# Patient Record
Sex: Male | Born: 1985 | Race: Black or African American | Hispanic: No | Marital: Single | Smoking: Never smoker
Health system: Southern US, Community
[De-identification: ages and names within clinical notes are randomized; demographics above are authoritative.]

## PROBLEM LIST (undated history)

## (undated) DIAGNOSIS — D229 Melanocytic nevi, unspecified: Secondary | ICD-10-CM

## (undated) DIAGNOSIS — I10 Essential (primary) hypertension: Secondary | ICD-10-CM

## (undated) HISTORY — PX: FRACTURE SURGERY: SHX138

## (undated) HISTORY — DX: Essential (primary) hypertension: I10

---
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
## 2007-07-02 ENCOUNTER — Emergency Department: Payer: Self-pay | Admitting: Emergency Medicine

## 2007-07-10 ENCOUNTER — Emergency Department: Payer: Self-pay | Admitting: Emergency Medicine
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
## 2009-11-11 ENCOUNTER — Emergency Department: Payer: Self-pay | Admitting: Emergency Medicine

## 2010-06-25 ENCOUNTER — Emergency Department: Payer: Self-pay | Admitting: Emergency Medicine

## 2010-12-20 ENCOUNTER — Emergency Department: Payer: Self-pay | Admitting: Emergency Medicine

## 2013-08-17 ENCOUNTER — Emergency Department: Payer: Self-pay | Admitting: Internal Medicine

## 2013-08-17 LAB — CBC
HCT: 43.2 % (ref 40.0–52.0)
HGB: 14.9 g/dL (ref 13.0–18.0)
MCH: 33.1 pg (ref 26.0–34.0)
MCV: 96 fL (ref 80–100)
Platelet: 209 10*3/uL (ref 150–440)
RBC: 4.5 10*6/uL (ref 4.40–5.90)
WBC: 11.6 10*3/uL — ABNORMAL HIGH (ref 3.8–10.6)

## 2013-08-17 LAB — COMPREHENSIVE METABOLIC PANEL
Alkaline Phosphatase: 64 U/L (ref 50–136)
Anion Gap: 6 — ABNORMAL LOW (ref 7–16)
EGFR (African American): >60
EGFR (Non-African Amer.): >60
Glucose: 102 mg/dL — ABNORMAL HIGH (ref 65–99)
Osmolality: 276 (ref 275–301)
Potassium: 3.7 mmol/L (ref 3.5–5.1)
SGOT(AST): 19 U/L (ref 15–37)
SGPT (ALT): 30 U/L (ref 12–78)
Total Protein: 7.3 g/dL (ref 6.4–8.2)

## 2013-08-17 LAB — URINALYSIS, COMPLETE
Bilirubin,UR: NEGATIVE
Blood: NEGATIVE
Ketone: NEGATIVE
Nitrite: NEGATIVE
Ph: 6 (ref 4.5–8.0)
Protein: NEGATIVE
RBC,UR: 3 /HPF (ref 0–5)
Specific Gravity: 1.024 (ref 1.003–1.030)
Squamous Epithelial: 3

## 2016-07-24 ENCOUNTER — Emergency Department: Payer: Self-pay

## 2016-07-24 ENCOUNTER — Emergency Department: Payer: Self-pay | Admitting: Anesthesiology

## 2016-07-24 ENCOUNTER — Encounter
Admission: EM | Disposition: A | Discharge status: Home / Self Care | Payer: Self-pay | Source: Home / Self Care | Attending: Student

## 2016-07-24 ENCOUNTER — Emergency Department
Admission: EM | Admit: 2016-07-24 | Discharge: 2016-07-24 | Disposition: A | Discharge status: Home / Self Care | Payer: Self-pay | Attending: Student | Admitting: Student

## 2016-07-24 ENCOUNTER — Encounter: Payer: Self-pay | Admitting: Emergency Medicine

## 2016-07-24 DIAGNOSIS — N4400 Torsion of testis, unspecified: Secondary | ICD-10-CM

## 2016-07-24 DIAGNOSIS — E669 Obesity, unspecified: Secondary | ICD-10-CM | POA: Insufficient documentation

## 2016-07-24 DIAGNOSIS — N50811 Right testicular pain: Secondary | ICD-10-CM

## 2016-07-24 DIAGNOSIS — R1031 Right lower quadrant pain: Secondary | ICD-10-CM

## 2016-07-24 DIAGNOSIS — Z6841 Body Mass Index (BMI) 40.0 and over, adult: Secondary | ICD-10-CM | POA: Insufficient documentation

## 2016-07-24 DIAGNOSIS — N433 Hydrocele, unspecified: Secondary | ICD-10-CM | POA: Insufficient documentation

## 2016-07-24 HISTORY — PX: TESTICLE TORSION REDUCTION: SHX795

## 2016-07-24 LAB — LIPASE, BLOOD: LIPASE: 21 U/L (ref 11–51)

## 2016-07-24 LAB — CHLAMYDIA/NGC RT PCR (ARMC ONLY)
Chlamydia Tr: NOT DETECTED
N gonorrhoeae: NOT DETECTED

## 2016-07-24 LAB — CBC WITH DIFFERENTIAL/PLATELET
BASOS PCT: 0 %
Basophils Absolute: 0 10*3/uL (ref 0–0.1)
EOS PCT: 0 %
Eosinophils Absolute: 0 10*3/uL (ref 0–0.7)
HEMATOCRIT: 48.4 % (ref 40.0–52.0)
Hemoglobin: 16.6 g/dL (ref 13.0–18.0)
Lymphocytes Relative: 6 %
Lymphs Abs: 0.8 10*3/uL — ABNORMAL LOW (ref 1.0–3.6)
MCH: 32.5 pg (ref 26.0–34.0)
MCHC: 34.4 g/dL (ref 32.0–36.0)
MCV: 94.5 fL (ref 80.0–100.0)
MONO ABS: 0.3 10*3/uL (ref 0.2–1.0)
MONOS PCT: 3 %
NEUTROS ABS: 12.6 10*3/uL — AB (ref 1.4–6.5)
Neutrophils Relative %: 91 %
PLATELETS: 230 10*3/uL (ref 150–440)
RBC: 5.12 MIL/uL (ref 4.40–5.90)
RDW: 12.9 % (ref 11.5–14.5)
WBC: 13.8 10*3/uL — ABNORMAL HIGH (ref 3.8–10.6)

## 2016-07-24 LAB — URINALYSIS COMPLETE WITH MICROSCOPIC (ARMC ONLY)
BILIRUBIN URINE: NEGATIVE
GLUCOSE, UA: NEGATIVE mg/dL
HGB URINE DIPSTICK: NEGATIVE
KETONES UR: NEGATIVE mg/dL
LEUKOCYTES UA: NEGATIVE
NITRITE: NEGATIVE
Protein, ur: 100 mg/dL — AB
SPECIFIC GRAVITY, URINE: 1.02 (ref 1.005–1.030)
pH: 9 — ABNORMAL HIGH (ref 5.0–8.0)

## 2016-07-24 LAB — COMPREHENSIVE METABOLIC PANEL
ALBUMIN: 4.9 g/dL (ref 3.5–5.0)
ALT: 28 U/L (ref 17–63)
ANION GAP: 7 (ref 5–15)
AST: 28 U/L (ref 15–41)
Alkaline Phosphatase: 55 U/L (ref 38–126)
BILIRUBIN TOTAL: 2.1 mg/dL — AB (ref 0.3–1.2)
BUN: 10 mg/dL (ref 6–20)
CHLORIDE: 106 mmol/L (ref 101–111)
CO2: 26 mmol/L (ref 22–32)
Calcium: 9.4 mg/dL (ref 8.9–10.3)
Creatinine, Ser: 0.9 mg/dL (ref 0.61–1.24)
GFR calc Af Amer: >60 mL/min (ref >60)
GLUCOSE: 127 mg/dL — AB (ref 65–99)
POTASSIUM: 3.9 mmol/L (ref 3.5–5.1)
Sodium: 139 mmol/L (ref 135–145)
TOTAL PROTEIN: 8.6 g/dL — AB (ref 6.5–8.1)

## 2016-07-24 SURGERY — TESTICULAR TORSION REPAIR
Anesthesia: General | Wound class: Clean Contaminated

## 2016-07-24 MED ORDER — KETOROLAC TROMETHAMINE 30 MG/ML IJ SOLN
15.0000 mg | Freq: Once | INTRAMUSCULAR | Status: AC
  Administered 2016-07-24: 15 mg via INTRAVENOUS
  Filled 2016-07-24: qty 1

## 2016-07-24 MED ORDER — PROPOFOL 10 MG/ML IV BOLUS
INTRAVENOUS | Status: DC | PRN
  Administered 2016-07-24: 250 mg via INTRAVENOUS

## 2016-07-24 MED ORDER — OXYCODONE-ACETAMINOPHEN 5-325 MG PO TABS
1.0000 | ORAL_TABLET | ORAL | Status: DC | PRN
  Administered 2016-07-24: 1 via ORAL

## 2016-07-24 MED ORDER — CEFAZOLIN SODIUM-DEXTROSE 2-4 GM/100ML-% IV SOLN: 2.0000 g | Freq: Three times a day (TID) | INTRAVENOUS | Status: DC

## 2016-07-24 MED ORDER — FENTANYL CITRATE (PF) 100 MCG/2ML IJ SOLN
INTRAMUSCULAR | Status: DC | PRN
  Administered 2016-07-24: 50 ug via INTRAVENOUS
  Administered 2016-07-24: 100 ug via INTRAVENOUS
  Administered 2016-07-24: 50 ug via INTRAVENOUS

## 2016-07-24 MED ORDER — BUPIVACAINE HCL 0.25 % IJ SOLN
INTRAMUSCULAR | Status: DC | PRN
  Administered 2016-07-24: 10 mL

## 2016-07-24 MED ORDER — OXYCODONE-ACETAMINOPHEN 5-325 MG PO TABS
ORAL_TABLET | ORAL | Status: AC
  Administered 2016-07-24: 1 via ORAL
  Filled 2016-07-24: qty 1

## 2016-07-24 MED ORDER — OXYCODONE-ACETAMINOPHEN 5-325 MG PO TABS: 1.0000 | ORAL_TABLET | ORAL | 0 refills | Status: DC | PRN

## 2016-07-24 MED ORDER — SULFAMETHOXAZOLE-TRIMETHOPRIM 800-160 MG PO TABS: 1.0000 | ORAL_TABLET | Freq: Two times a day (BID) | ORAL | 0 refills | Status: DC

## 2016-07-24 MED ORDER — SUCCINYLCHOLINE CHLORIDE 20 MG/ML IJ SOLN
INTRAMUSCULAR | Status: DC | PRN
Start: 2016-07-24 — End: 2016-07-24
  Administered 2016-07-24: 180 mg via INTRAVENOUS

## 2016-07-24 MED ORDER — SODIUM CHLORIDE 0.9 % IV BOLUS (SEPSIS)
1000.0000 mL | Freq: Once | INTRAVENOUS | Status: AC
  Administered 2016-07-24: 1000 mL via INTRAVENOUS

## 2016-07-24 MED ORDER — CEFAZOLIN SODIUM 1 G IJ SOLR
INTRAMUSCULAR | Status: DC | PRN
  Administered 2016-07-24: 3 g via INTRAMUSCULAR

## 2016-07-24 MED ORDER — ONDANSETRON HCL 4 MG/2ML IJ SOLN
INTRAMUSCULAR | Status: DC | PRN
  Administered 2016-07-24: 4 mg via INTRAVENOUS

## 2016-07-24 MED ORDER — PROMETHAZINE HCL 25 MG/ML IJ SOLN: 6.2500 mg | INTRAMUSCULAR | Status: DC | PRN

## 2016-07-24 MED ORDER — LACTATED RINGERS IV SOLN
INTRAVENOUS | Status: DC | PRN
  Administered 2016-07-24: 12:00:00 via INTRAVENOUS

## 2016-07-24 MED ORDER — FENTANYL CITRATE (PF) 100 MCG/2ML IJ SOLN: 25.0000 ug | INTRAMUSCULAR | Status: DC | PRN

## 2016-07-24 MED ORDER — SODIUM CHLORIDE 0.9 % IV BOLUS (SEPSIS): 500.0000 mL | Freq: Once | INTRAVENOUS | Status: DC

## 2016-07-24 MED ORDER — LIDOCAINE HCL (CARDIAC) 20 MG/ML IV SOLN
INTRAVENOUS | Status: DC | PRN
  Administered 2016-07-24: 100 mg via INTRAVENOUS

## 2016-07-24 MED ORDER — ONDANSETRON HCL 4 MG/2ML IJ SOLN
4.0000 mg | Freq: Once | INTRAMUSCULAR | Status: AC
  Administered 2016-07-24: 4 mg via INTRAVENOUS
  Filled 2016-07-24: qty 2

## 2016-07-24 SURGICAL SUPPLY — 29 items
BLADE SURG 15 STRL LF DISP TIS (BLADE) ×1 IMPLANT
BLADE SURG 15 STRL SS (BLADE) ×2
CANISTER SUCT 1200ML W/VALVE (MISCELLANEOUS) ×3 IMPLANT
CHLORAPREP W/TINT 26ML (MISCELLANEOUS) ×3 IMPLANT
DRAPE LAPAROTOMY 77X122 PED (DRAPES) ×3 IMPLANT
ELECT REM PT RETURN 9FT ADLT (ELECTROSURGICAL) ×3
ELECTRODE REM PT RTRN 9FT ADLT (ELECTROSURGICAL) ×1 IMPLANT
GAUZE FLUFF 18X24 1PLY STRL (GAUZE/BANDAGES/DRESSINGS) ×3 IMPLANT
GLOVE BIO SURGEON STRL SZ 6.5 (GLOVE) ×6 IMPLANT
GLOVE BIO SURGEON STRL SZ7 (GLOVE) ×3 IMPLANT
GLOVE BIO SURGEONS STRL SZ 6.5 (GLOVE) ×3
GOWN STRL REUS W/ TWL LRG LVL3 (GOWN DISPOSABLE) ×2 IMPLANT
GOWN STRL REUS W/TWL LRG LVL3 (GOWN DISPOSABLE) ×4
KIT RM TURNOVER STRD PROC AR (KITS) ×3 IMPLANT
LIQUID BAND (GAUZE/BANDAGES/DRESSINGS) ×3 IMPLANT
NEEDLE HYPO 25X1 1.5 SAFETY (NEEDLE) ×3 IMPLANT
NS IRRIG 500ML POUR BTL (IV SOLUTION) ×3 IMPLANT
PACK BASIN MINOR ARMC (MISCELLANEOUS) ×3 IMPLANT
PREP PVP WINGED SPONGE (MISCELLANEOUS) IMPLANT
SPONGE KITTNER 5P (MISCELLANEOUS) IMPLANT
SUPPORETR ATHLETIC LG (MISCELLANEOUS) ×1 IMPLANT
SUPPORTER ATHLETIC LG (MISCELLANEOUS) ×3
SUT CHROMIC 3 0 PS 2 (SUTURE) ×3 IMPLANT
SUT CHROMIC 4 0 RB 1X27 (SUTURE) ×3 IMPLANT
SUT MNCRL 4-0 (SUTURE) ×2
SUT MNCRL 4-0 27XMFL (SUTURE) ×1
SUT VICRYL 3-0 27IN (SUTURE) ×3 IMPLANT
SUTURE MNCRL 4-0 27XMF (SUTURE) ×1 IMPLANT
SYRINGE 10CC LL (SYRINGE) ×3 IMPLANT

## 2016-07-24 NOTE — ED Triage Notes (Signed)
Pt with right lower abd pain and right testicle pain. Also with back pain.

## 2016-07-24 NOTE — Anesthesia Preprocedure Evaluation (Signed)
Anesthesia Evaluation  Patient identified by MRN, date of birth, ID band Patient awake    Reviewed: Allergy & Precautions, H&P , NPO status , Patient's Chart, lab work & pertinent test results, reviewed documented beta blocker date and time   History of Anesthesia Complications Negative for: history of anesthetic complications  Airway Mallampati: III  TM Distance: >3 FB Neck ROM: full    Dental no notable dental hx. (+) Teeth Intact   Pulmonary neg pulmonary ROS,    Pulmonary exam normal breath sounds clear to auscultation       Cardiovascular Exercise Tolerance: Good negative cardio ROS Normal cardiovascular exam Rhythm:regular Rate:Normal     Neuro/Psych negative neurological ROS  negative psych ROS   GI/Hepatic Neg liver ROS, GERD  ,  Endo/Other  neg diabetesMorbid obesity  Renal/GU negative Renal ROS  negative genitourinary   Musculoskeletal   Abdominal   Peds  Hematology negative hematology ROS (+)   Anesthesia Other Findings History reviewed. No pertinent past medical history.   Reproductive/Obstetrics negative OB ROS                             Anesthesia Physical Anesthesia Plan  ASA: III  Anesthesia Plan: General ETT   Post-op Pain Management:    Induction:   Airway Management Planned:   Additional Equipment:   Intra-op Plan:   Post-operative Plan:   Informed Consent: I have reviewed the patients History and Physical, chart, labs and discussed the procedure including the risks, benefits and alternatives for the proposed anesthesia with the patient or authorized representative who has indicated his/her understanding and acceptance.   Dental Advisory Given  Plan Discussed with: Anesthesiologist, CRNA and Surgeon  Anesthesia Plan Comments:         Anesthesia Quick Evaluation

## 2016-07-24 NOTE — Anesthesia Procedure Notes (Signed)
Procedure Name: Intubation Performed by: Ashyla Luth Pre-anesthesia Checklist: Patient identified, Patient being monitored, Timeout performed, Emergency Drugs available and Suction available Patient Re-evaluated:Patient Re-evaluated prior to inductionOxygen Delivery Method: Circle system utilized Preoxygenation: Pre-oxygenation with 100% oxygen Intubation Type: IV induction Ventilation: Mask ventilation without difficulty Laryngoscope Size: Mac and 4 Grade View: Grade I Tube type: Oral Tube size: 7.5 mm Number of attempts: 1 Airway Equipment and Method: Stylet Placement Confirmation: ETT inserted through vocal cords under direct vision,  positive ETCO2 and breath sounds checked- equal and bilateral Secured at: 23 cm Tube secured with: Tape Dental Injury: Teeth and Oropharynx as per pre-operative assessment        

## 2016-07-24 NOTE — Discharge Instructions (Signed)
Testicular Torsion Testicular torsion is a twisting of the spermatic cord, artery, and vein that go to the testicle. This twisting cuts off the blood supply to everything in the sac that contains the testes, blood vessels, and part of the spermatic cord (scrotum). Testicular torsion is most commonly seen in newborn and adolescent males. It can also occur before birth. Testicular torsion requires emergency treatment. The testicle usually can be saved if the torsion is treated within 6 hours of onset. If the torsion is left untreated for too long, the testicle will die and have to be removed.  CAUSES  Torsion can be caused by a hit on the scrotum or by certain movements during exercise. In some males, testicular torsion is more common because the connection of their testicle to a specific tissue in their scrotum developed in the wrong place, allowing the testicle to rotate and the cord to get twisted.  SIGNS AND SYMPTOMS  The main symptom of testicular torsion is pain in your testicle. The scrotum may be swollen, red, hard, and very tender. There will be excess fluid in the tissue (edema).The testicle may be higher than normal in the scrotum. The skin of the scrotum may be stuck to the testicle. You may have nausea, vomiting, and a fever. DIAGNOSIS  Often testicular torsion is diagnosed through a physical exam. Sometimes imaging exams and tests to measure blood flow may be done. TREATMENT  A manual untwisting of the testicle may be done when the testicle is still mobile and the maneuver is not too painful. However, surgery usually is necessary and should be done as soon as possible after torsion occurs. During surgery, the testicle is untwisted and evaluated and possibly removed.    This information is not intended to replace advice given to you by your health care provider. Make sure you discuss any questions you have with your health care provider.   Document Released: 10/10/2005 Document Revised:  10/15/2013 Document Reviewed: 04/01/2013 Elsevier Interactive Patient Education 2016 [employer]   AMBULATORY SURGERY  DISCHARGE INSTRUCTIONS   1) The drugs that you were given will stay in your system until tomorrow so for the next 24 hours you should not:  A) Drive an automobile B) Make any legal decisions C) Drink any alcoholic beverage   2) You may resume regular meals tomorrow.  Today it is better to start with liquids and gradually work up to solid foods.  You may eat anything you prefer, but it is better to start with liquids, then soup and crackers, and gradually work up to solid foods.   3) Please notify your doctor immediately if you have any unusual bleeding, trouble breathing, redness and pain at the surgery site, drainage, fever, or pain not relieved by medication.    4) Additional Instructions:Pt may return to work on Monday October 9.        Please contact your physician with any problems or Same Day Surgery at [phone], Monday through Friday 6 am to 4 pm, or [place] [facility] Main number at [phone].

## 2016-07-24 NOTE — ED Provider Notes (Signed)
[facility] Emergency Department Provider Note   ____________________________________________   First MD Initiated Contact with Patient 07/24/16 [phone]     (approximate)  I have reviewed the triage vital signs and the nursing notes.   HISTORY  Chief Complaint Abdominal Pain and Testicle Pain    HPI Phillip Guerrero is a 30 y.o. male with no chronic medical problems presents for evaluation of right lower quadrant pain radiating into the right testicle and the right flank, sudden onset this morning, constant since onset, no modifying factors. No nausea or vomiting or diarrhea, no fevers or chills. He thought he needed to have a bowel movement, but he did have a normal bowel movement this morning and has had persistent pain. He took a laxative with no improvement. He has not had any abnormal penile discharge, no hematuria or dysuria.   No past medical history on file.  There are no active problems to display for this patient.   Past Surgical History:  Procedure Laterality Date  . FRACTURE SURGERY      Prior to Admission medications   Not on File    Allergies Review of patient's allergies indicates no known allergies.  No family history on file.  Social History Social History  Substance Use Topics  . Smoking status: Never Smoker  . Smokeless tobacco: Never Used  . Alcohol use Yes    Review of Systems Constitutional: No fever/chills Eyes: No visual changes. ENT: No sore throat. Cardiovascular: Denies chest pain. Respiratory: Denies shortness of breath. Gastrointestinal: + abdominal pain.  No nausea, no vomiting.  No diarrhea.  No constipation. Genitourinary: Negative for dysuria. Musculoskeletal: Positive for right flank pain. Skin: Negative for rash. Neurological: Negative for headaches, focal weakness or numbness.  10-point ROS otherwise negative.  ____________________________________________   PHYSICAL EXAM:  VITAL SIGNS: ED  Triage Vitals  Enc Vitals Group     BP 07/24/16 0903 (!) 155/88     Pulse Rate 07/24/16 0903 63     Resp --      Temp 07/24/16 0903 98.1 F (36.7 C)     Temp Source 07/24/16 0903 Oral     SpO2 07/24/16 0903 98 %     Weight 07/24/16 0903 (!) 373 lb (169.2 kg)     Height 07/24/16 0903 6\' 4"  (1.93 m)     Head Circumference --      Peak Flow --      Pain Score 07/24/16 0904 7     Pain Loc --      Pain Edu? --      Excl. in GC? --     Constitutional: Alert and oriented. Well appearing and in no acute distress. Eyes: Conjunctivae are normal. PERRL. EOMI. Head: Atraumatic. Nose: No congestion/rhinnorhea. Mouth/Throat: Mucous membranes are moist.  Oropharynx non-erythematous. Neck: No stridor.  Supple without meningismus. Cardiovascular: Normal rate, regular rhythm. Grossly normal heart sounds.  Good peripheral circulation. Respiratory: Normal respiratory effort.  No retractions. Lungs CTAB. Gastrointestinal: Soft With tenderness to palpation in the right mid abdomen in the right lower quadrant. No CVA tenderness. Genitourinary: exam preformed with nurse Kim at bedside, testicles are descended bilaterally, they are not edematous, there is no discoloration but there is some mild tenderness in the right testicle. Musculoskeletal: No lower extremity tenderness nor edema.  No joint effusions. Neurologic:  Normal speech and language. No gross focal neurologic deficits are appreciated. No gait instability. Skin:  Skin is warm, dry and intact. No rash noted. Psychiatric: Mood  and affect are normal. Speech and behavior are normal.  ____________________________________________   LABS (all labs ordered are listed, but only abnormal results are displayed)  Labs Reviewed  CBC WITH DIFFERENTIAL/PLATELET - Abnormal; Notable for the following:       Result Value   WBC 13.8 (*)    Neutro Abs 12.6 (*)    Lymphs Abs 0.8 (*)    All other components within normal limits  COMPREHENSIVE METABOLIC  PANEL - Abnormal; Notable for the following:    Glucose, Bld 127 (*)    Total Protein 8.6 (*)    Total Bilirubin 2.1 (*)    All other components within normal limits  URINALYSIS COMPLETEWITH MICROSCOPIC ([facility] ONLY) - Abnormal; Notable for the following:    Color, Urine YELLOW (*)    APPearance CLOUDY (*)    pH 9.0 (*)    Protein, ur 100 (*)    Bacteria, UA MANY (*)    Squamous Epithelial / LPF 0-5 (*)    All other components within normal limits  CHLAMYDIA/NGC RT PCR ([facility] ONLY)  LIPASE, BLOOD   ____________________________________________  EKG  none ____________________________________________  RADIOLOGY  US scrotum IMPRESSION:  1. Positive for right testicular torsion. Absent blood flow to the  right testicle and epididymis. Echogenicity of the right testicle  remains normal. Small simple appearing right hydrocele.  2. Normal left testicle.       ____________________________________________   PROCEDURES  Procedure(s) performed: None  Procedures  Critical Care performed:   CRITICAL CARE Performed by: Toney Rakes A   Total critical care time: 30 minutes  Critical care time was exclusive of separately billable procedures and treating other patients.  Critical care was necessary to treat or prevent imminent or life-threatening deterioration.  Critical care was time spent personally by me on the following activities: development of treatment plan with patient and/or surrogate as well as nursing, discussions with consultants, evaluation of patient's response to treatment, examination of patient, obtaining history from patient or surrogate, ordering and performing treatments and interventions, ordering and review of laboratory studies, ordering and review of radiographic studies, pulse oximetry and re-evaluation of patient's condition.  ____________________________________________   INITIAL IMPRESSION / ASSESSMENT AND PLAN / ED COURSE  Pertinent  labs & imaging results that were available during my care of the patient were reviewed by me and considered in my medical decision making (see chart for details).  Phillip Guerrero is a 30 y.o. male with no chronic medical problems presents for evaluation of right lower quadrant pain radiating into the right testicle and the right back, sudden onset this morning, constant since onset, no modifying factors. On exam, he is nontoxic appearing and in no acute distress. Vital signs stable, he is afebrile. No tenderness to palpation in the right mid abdomen in the right lower quadrant as well as the right testicle. We'll obtain screening labs, urinalysis, urine GC chlamydia, treat him symptomatically. Will begin with an ultrasound of the scrotum to rule out torsion, if unremarkable, we'll likely pursue CT of the abdomen and pelvis to evaluate for obstructing kidney stone or appendicitis.  ----------------------------------------- 11:02 AM on 07/24/2016 ----------------------------------------- CBC with leukocytosis, unremarkable CMP, urinalysis with many bacteria, no leukocytes or nitrites and minimal WBCs. Ultrasound shows testicular torsion. I discussed this with Dr. Thea Silversmith of urology who will evaluate the patient and taken to the operating room. Patient reports his pain is improved at this time.   Clinical Course     ____________________________________________   FINAL CLINICAL  IMPRESSION(S) / ED DIAGNOSES  Final diagnoses:  Pain in right testicle  Right lower quadrant abdominal pain  Testicular torsion      NEW MEDICATIONS STARTED DURING THIS VISIT:  New Prescriptions   No medications on file     Note:  This document was prepared using Dragon voice recognition software and may include unintentional dictation errors.    Gayla Doss, MD 07/24/16 [phone]

## 2016-07-24 NOTE — ED Notes (Signed)
Patient transported to Ultrasound 

## 2016-07-24 NOTE — Brief Op Note (Signed)
07/24/2016  12:57 PM  PATIENT:  Phillip Guerrero  30 y.o. male  PRE-OPERATIVE DIAGNOSIS:  testicular torsion  POST-OPERATIVE DIAGNOSIS:  testicular torsion  PROCEDURE:  Procedure(s): TESTICULAR TORSION REPAIR (N/A)  SURGEON:  Surgeon(s) and Role:    * Malen GauzePatrick L Renu Asby, MD - Primary  PHYSICIAN ASSISTANT:   ASSISTANTS: none   ANESTHESIA:   general  EBL:  Total I/O In: 1450 [I.V.:450; IV Piggyback:1000] Out: -   BLOOD ADMINISTERED:none  DRAINS: none   LOCAL MEDICATIONS USED:  MARCAINE     SPECIMEN:  No Specimen  DISPOSITION OF SPECIMEN:  N/A  COUNTS:  YES  TOURNIQUET:  * No tourniquets in log *  DICTATION: .Note written in EPIC  PLAN OF CARE: Discharge to home after PACU  PATIENT DISPOSITION:  PACU - hemodynamically stable.   Delay start of Pharmacological VTE agent (>24hrs) due to surgical blood loss or risk of bleeding: not applicable

## 2016-07-24 NOTE — Transfer of Care (Signed)
Immediate Anesthesia Transfer of Care Note  Patient: Phillip Guerrero  Procedure(s) Performed: Procedure(s): TESTICULAR TORSION REPAIR (N/A)  Patient Location: PACU  Anesthesia Type:General  Level of Consciousness: sedated and responds to stimulation  Airway & Oxygen Therapy: Patient Spontanous Breathing and Patient connected to face mask oxygen  Post-op Assessment: Report given to RN and Post -op Vital signs reviewed and stable  Post vital signs: Reviewed and stable  Last Vitals:  Vitals:   07/24/16 1140 07/24/16 1309  BP: (!) 157/76 137/83  Pulse: 64 92  Resp:  14  Temp: 36.8 C     Last Pain:  Vitals:   07/24/16 1140  TempSrc: Oral  PainSc:          Complications: No apparent anesthesia complications

## 2016-07-24 NOTE — ED Notes (Signed)
Consent signed by patient. Per patient's request, patient was unhooked from fluids to use the restroom after taking a laxative at home this morning.

## 2016-07-24 NOTE — Consult Note (Signed)
Urology Consult  Referring physician: Dr. Edd Fabian Reason for referral: Testicular torsion  Chief Complaint: Right testicular pain  History of Present Illness: Mr Littles is a 30yo with no significant PMH that was awoken by severe right testicular pain. He has never had this pain before. The pain is sharp, constant, severe, nonradiating right testicular pain. He has associated nausea and vomiting. No exacerbating/alleviating events. No LUTS. Scrotal US shows absent right testicular and epididymal blood flow  No past medical history on file. Past Surgical History:  Procedure Laterality Date  . FRACTURE SURGERY      Medications: I have reviewed the patient's current medications. Allergies: No Known Allergies  No family history on file. Social History:  reports that he has never smoked. He has never used smokeless tobacco. He reports that he drinks alcohol. He reports that he does not use drugs.  Review of Systems  Constitutional: Positive for chills.  Gastrointestinal: Positive for abdominal pain, nausea and vomiting.    Physical Exam:  Vital signs in last 24 hours: Temp:  [98.1 F (36.7 C)] 98.1 F (36.7 C) (10/01 0903) Pulse Rate:  [63] 63 (10/01 0903) BP: (155)/(88) 155/88 (10/01 0903) SpO2:  [98 %] 98 % (10/01 0903) Weight:  [169.2 kg (373 lb)] 169.2 kg (373 lb) (10/01 5726) Physical Exam  Constitutional: He is oriented to person, place, and time. He appears well-developed and well-nourished.  HENT:  Head: Normocephalic and atraumatic.  Eyes: EOM are normal. Pupils are equal, round, and reactive to light.  Neck: Normal range of motion. No thyromegaly present.  Cardiovascular: Normal rate and regular rhythm.   Respiratory: Effort normal. No respiratory distress.  GI: Soft. He exhibits no distension. Hernia confirmed negative in the right inguinal area and confirmed negative in the left inguinal area.  Genitourinary: Penis normal. Right testis shows swelling and tenderness.  Right testis shows no mass. Right testis is descended. Cremasteric reflex is absent on the right side. Left testis shows no mass, no swelling and no tenderness. Left testis is descended. Cremasteric reflex is not absent on the left side. Circumcised.  Musculoskeletal: Normal range of motion. He exhibits no edema.  Lymphadenopathy:       Right: No inguinal adenopathy present.       Left: No inguinal adenopathy present.  Neurological: He is alert and oriented to person, place, and time.  Skin: Skin is warm and dry.  Psychiatric: He has a normal mood and affect. His behavior is normal. Judgment and thought content normal.    Laboratory Data:  Results for orders placed or performed during the hospital encounter of 07/24/16 (from the past 72 hour(s))  CBC with Differential     Status: Abnormal   Collection Time: 07/24/16  9:40 AM  Result Value Ref Range   WBC 13.8 (H) 3.8 - 10.6 K/uL   RBC 5.12 4.40 - 5.90 MIL/uL   Hemoglobin 16.6 13.0 - 18.0 g/dL   HCT 48.4 40.0 - 52.0 %   MCV 94.5 80.0 - 100.0 fL   MCH 32.5 26.0 - 34.0 pg   MCHC 34.4 32.0 - 36.0 g/dL   RDW 12.9 11.5 - 14.5 %   Platelets 230 150 - 440 K/uL   Neutrophils Relative % 91 %   Neutro Abs 12.6 (H) 1.4 - 6.5 K/uL   Lymphocytes Relative 6 %   Lymphs Abs 0.8 (L) 1.0 - 3.6 K/uL   Monocytes Relative 3 %   Monocytes Absolute 0.3 0.2 - 1.0 K/uL   Eosinophils Relative  0 %   Eosinophils Absolute 0.0 0 - 0.7 K/uL   Basophils Relative 0 %   Basophils Absolute 0.0 0 - 0.1 K/uL  Comprehensive metabolic panel     Status: Abnormal   Collection Time: 07/24/16  9:40 AM  Result Value Ref Range   Sodium 139 135 - 145 mmol/L   Potassium 3.9 3.5 - 5.1 mmol/L   Chloride 106 101 - 111 mmol/L   CO2 26 22 - 32 mmol/L   Glucose, Bld 127 (H) 65 - 99 mg/dL   BUN 10 6 - 20 mg/dL   Creatinine, Ser 0.90 0.61 - 1.24 mg/dL   Calcium 9.4 8.9 - 10.3 mg/dL   Total Protein 8.6 (H) 6.5 - 8.1 g/dL   Albumin 4.9 3.5 - 5.0 g/dL   AST 28 15 - 41 U/L    ALT 28 17 - 63 U/L   Alkaline Phosphatase 55 38 - 126 U/L   Total Bilirubin 2.1 (H) 0.3 - 1.2 mg/dL   GFR calc non Af Amer >60 >60 mL/min   GFR calc Af Amer >60 >60 mL/min    Comment: (NOTE) The eGFR has been calculated using the CKD EPI equation. This calculation has not been validated in all clinical situations. eGFR's persistently <60 mL/min signify possible Chronic Kidney Disease.    Anion gap 7 5 - 15  Lipase, blood     Status: None   Collection Time: 07/24/16  9:40 AM  Result Value Ref Range   Lipase 21 11 - 51 U/L   No results found for this or any previous visit (from the past 240 hour(s)). Creatinine:  Recent Labs  07/24/16 0940  CREATININE 0.90   Baseline Creatinine: 0.9  Impression/Assessment:  30yo with right testicular torsion  Plan:  The risks/benefits/alternatives to bilateral orchidopexy was explained to the patient and he understands and wishes to proceed with surgery. Patient manually detorsed at the bedside  Nicolette Bang 07/24/2016, 10:50 AM

## 2016-07-24 NOTE — OR Nursing (Signed)
Received IVF Total (OR and PACU/Postop)

## 2016-07-25 ENCOUNTER — Encounter: Payer: Self-pay | Admitting: Urology

## 2016-07-25 NOTE — Anesthesia Postprocedure Evaluation (Signed)
Anesthesia Post Note  Patient: Phillip Guerrero  Procedure(s) Performed: Procedure(s) (LRB): TESTICULAR TORSION REPAIR (N/A)  Patient location during evaluation: PACU Anesthesia Type: General Level of consciousness: awake and alert Pain management: pain level controlled Vital Signs Assessment: post-procedure vital signs reviewed and stable Respiratory status: spontaneous breathing, nonlabored ventilation, respiratory function stable and patient connected to nasal cannula oxygen Cardiovascular status: blood pressure returned to baseline and stable Postop Assessment: no signs of nausea or vomiting Anesthetic complications: no    Last Vitals:  Vitals:   07/24/16 1432 07/24/16 1450  BP:  (!) 152/83  Pulse: 80 80  Resp: 20 20  Temp:      Last Pain:  Vitals:   07/24/16 1450  TempSrc:   PainSc: 3                  Lenard SimmerAndrew Nature Vogelsang

## 2016-08-01 NOTE — Op Note (Signed)
Preoperative diagnosis: Right testicular torsion  Postoperative diagnosis: Same  Procedure: 1. Excision of bilateral appendix testis 2. Bilateral orchidopexy  Attending: Wilkie AyePatrick Mikeria Valin, MD  Anesthesia: General  History of blood loss: Minimal  Antibiotics: ancef  Drains: none  Specimens: none   Findings: 360 degree right testicular torsion  Indications: Patient is a 30 year old male with right testicular torsion. After discussing the treatment options the patient elected to proceed to bilateral scrotal exploration and bilateral orchidopexy  Procedure in detail: Prior to procedure consent was obtained.  Patient was brought to the operating room and a brief timeout was done to ensure correct patient, correct procedure, correct site.  General anesthesia was administered and patient was placed in supine position.  His genitalia was then prepped and draped in usual sterile fashion.  A 3 cm incision was made in the right hemiscrotum.  We dissected down to the tunica and then incised the tunica. A small hydrocele was encountered and was drained. We noted the testicle to be torsed 360 degrees which was then promptly corrected and bloodflow was ensured with the microdoppler. We then excised the hydrocele sac and then over sewed the edge with 2-0 Vicryl in a running fashion. We then excised the right appendix testis. Hemostasis was then obtained with electrocautery.  We then returned the testis to the right hemiscrotum and placed 3-0 proline suture on the medial and lateral aspect of the testis. We then attached this to the medial and lateral aspect of the dartos respectively. We then closed the overlying dartos with 3-0 vicryl in a running fashion. The skin was then closed with 4-0 monocryl in a running fashion.  We then turned our attention to the left side. A 3 cm incision was made in the left hemiscrotum.  We dissected down to the tunica and then incised the tunica. A small hydrocele was  encountered and was drained.  We then excised the hydrocele sac and then over sewed the edge with 2-0 Vicryl in a running fashion. We then excised the left appendix testis. Hemostasis was then obtained with electrocautery.  We then returned the testis to the left hemiscrotum and placed 3-0 proline suture on the medial and lateral aspect of the testis. We then attached this to the medial and lateral aspect of the dartos respectively. We thn closed the overlying dartos with 3-0 vicryl in a running fashion. The skin was then closed with 4-0 monocryl in a running fashion.Dermabond was placed on the incision.  A dressing was then applied to the incision.  We then placed a scrotal fluff and this then concluded the procedure which was well tolerated by the patient.  Complications: None  Condition: Stable, extubated, transferred to PACU.  Plan: Patient is to be discharged home.  He is to follow up in 2 weeks for wound check.

## 2016-08-05 ENCOUNTER — Ambulatory Visit: Payer: Self-pay

## 2016-08-11 ENCOUNTER — Ambulatory Visit (INDEPENDENT_AMBULATORY_CARE_PROVIDER_SITE_OTHER): Payer: Self-pay | Admitting: Urology

## 2016-08-11 ENCOUNTER — Encounter: Payer: Self-pay | Admitting: Urology

## 2016-08-11 VITALS — BP 150/89 | HR 73 | Ht 76.0 in | Wt 372.6 lb

## 2016-08-11 DIAGNOSIS — N4400 Torsion of testis, unspecified: Secondary | ICD-10-CM

## 2016-08-11 NOTE — Progress Notes (Signed)
08/11/2016 11:13 AM   Jaci CarrelAdrian C Viscuso 12-09-1985 [id number]  Referring provider: No referring provider defined for this encounter.  Chief Complaint  Patient presents with  . Routine Post Op    tesricular torsion     HPI: The patient is a 30 year old gentleman who recently had a  right testicular torsion was underwent bilateral orchiopexy. He presents today for wound check. He is doing well since surgery. He sells minor discomfort in his testicles after surgery but no complaints. No fevers or chills. No drainage from his incisions.   PMH: No past medical history on file.  Surgical History: Past Surgical History:  Procedure Laterality Date  . FRACTURE SURGERY    . TESTICLE TORSION REDUCTION N/A 07/24/2016   Procedure: TESTICULAR TORSION REPAIR;  Surgeon: Malen GauzePatrick L McKenzie, MD;  Location: [facility] ORS;  Service: Urology;  Laterality: N/A;    Home Medications:    Medication List    as of 08/11/2016 11:13 AM   You have not been prescribed any medications.     Allergies: No Known Allergies  Family History: No family history on file.  Social History:  reports that he has never smoked. He has never used smokeless tobacco. He reports that he drinks alcohol. He reports that he does not use drugs.  ROS: UROLOGY Frequent Urination?: No Hard to postpone urination?: No Burning/pain with urination?: No Get up at night to urinate?: No Leakage of urine?: No Urine stream starts and stops?: No Trouble starting stream?: No Do you have to strain to urinate?: No Blood in urine?: No Urinary tract infection?: No Sexually transmitted disease?: No Injury to kidneys or bladder?: No Painful intercourse?: No Weak stream?: No Erection problems?: No Penile pain?: No  Gastrointestinal Nausea?: No Vomiting?: No Indigestion/heartburn?: No Diarrhea?: No Constipation?: No  Constitutional Fever: No Night sweats?: No Weight loss?: No Fatigue?: No  Skin Skin rash/lesions?:  No Itching?: No  Eyes Blurred vision?: No Double vision?: No  Ears/Nose/Throat Sore throat?: No Sinus problems?: No  Hematologic/Lymphatic Swollen glands?: No Easy bruising?: No  Cardiovascular Leg swelling?: No Chest pain?: No  Respiratory Cough?: No Shortness of breath?: No  Endocrine Excessive thirst?: No  Musculoskeletal Back pain?: No Joint pain?: No  Neurological Headaches?: No Dizziness?: No  Psychologic Depression?: No Anxiety?: No  Physical Exam: BP (!) 150/89   Pulse 73   Ht 6\' 4"  (1.93 m)   Wt (!) 372 lb 9.6 oz (169 kg)   BMI 45.35 kg/m   Constitutional:  Alert and oriented, No acute distress. HEENT: [place] AT, moist mucus membranes.  Trachea midline, no masses. Cardiovascular: No clubbing, cyanosis, or edema. Respiratory: Normal respiratory effort, no increased work of breathing. GI: Abdomen is soft, nontender, nondistended, no abdominal masses GU: No CVA tenderness. Scrotal incisions healing well post surgery. Minimal edema. Mild tenderness to palpation that is appropriate. No sign of infection. Skin: No rashes, bruises or suspicious lesions. Lymph: No cervical or inguinal adenopathy. Neurologic: Grossly intact, no focal deficits, moving all 4 extremities. Psychiatric: Normal mood and affect.  Laboratory Data: Lab Results  Component Value Date   WBC 13.8 (H) 07/24/2016   HGB 16.6 07/24/2016   HCT 48.4 07/24/2016   MCV 94.5 07/24/2016   PLT 230 07/24/2016    Lab Results  Component Value Date   CREATININE 0.90 07/24/2016    No results found for: PSA  No results found for: TESTOSTERONE  No results found for: HGBA1C  Urinalysis    Component Value Date/Time   COLORURINE  YELLOW (A) 07/24/2016 0941   APPEARANCEUR CLOUDY (A) 07/24/2016 0941   APPEARANCEUR Clear 08/17/2013 0759   LABSPEC 1.020 07/24/2016 0941   LABSPEC 1.024 08/17/2013 0759   PHURINE 9.0 (H) 07/24/2016 0941   GLUCOSEU NEGATIVE 07/24/2016 0941   GLUCOSEU Negative  08/17/2013 0759   HGBUR NEGATIVE 07/24/2016 0941   BILIRUBINUR NEGATIVE 07/24/2016 0941   BILIRUBINUR Negative 08/17/2013 0759   KETONESUR NEGATIVE 07/24/2016 0941   PROTEINUR 100 (A) 07/24/2016 0941   NITRITE NEGATIVE 07/24/2016 0941   LEUKOCYTESUR NEGATIVE 07/24/2016 0941   LEUKOCYTESUR 1+ 08/17/2013 0759      Assessment & Plan:    1. Testicular torsion The patient is doing well status post orchiopexy bilaterally. The patient will follow-up as needed.  Return if symptoms worsen or fail to improve.  Hildred Laser, MD  [facility] [address] [place], [place] [address] [phone]

## 2016-09-16 ENCOUNTER — Ambulatory Visit (INDEPENDENT_AMBULATORY_CARE_PROVIDER_SITE_OTHER): Payer: Self-pay | Admitting: Physician Assistant

## 2016-09-16 VITALS — BP 128/84 | HR 84 | Temp 98.1°F | Resp 16 | Ht 76.0 in | Wt 375.4 lb

## 2016-09-16 DIAGNOSIS — Z0289 Encounter for other administrative examinations: Secondary | ICD-10-CM

## 2016-09-16 NOTE — Progress Notes (Signed)
Commercial Driver Medical Examination   Phillip Guerrero is a [age] y.o. male who presents today for a commercial driver fitness determination physical exam. The patient reports no problems today. In the past the patient reports receiving 2 year certificates. He denies focal neurological deficits, vision and hearing changes. He denies the habitual use of benzodiazepines and opioids.  History reviewed. No pertinent past medical history.  Current medications, family history, allergies, social history reviewed by me and exist elsewhere in the encounter.   Review of Systems  Constitutional: Negative for fever.  Respiratory: Negative for cough and shortness of breath.   Cardiovascular: Negative for chest pain and leg swelling.  Skin: Negative for rash.  Neurological: Negative for dizziness, tingling, tremors, sensory change, speech change, focal weakness, seizures, loss of consciousness and headaches.  Endo/Heme/Allergies: Negative for polydipsia.    Objective:     Vision/hearing:  Visual Acuity Screening   Right eye Left eye Both eyes  Without correction: 20/20 20/2015 20/20  With correction:     Comments: Sees at 70 degrees  Hearing Screening Comments: Hears a whisper at 10 feet  Applicant can recognize and distinguish among traffic control signals and devices showing standard red, green, and amber colors.  Corrective lenses required: No  Monocular Vision?: No  Hearing aid requirement: No  Physical Exam  Constitutional: He is oriented to person, place, and time. He appears well-developed and well-nourished.  HENT:  Head: Normocephalic and atraumatic.  Right Ear: Hearing, tympanic membrane, external ear and ear canal normal.  Left Ear: Hearing, tympanic membrane, external ear and ear canal normal.  Nose: Nose normal.  Mouth/Throat: Uvula is midline, oropharynx is clear and moist and mucous membranes are normal.  Eyes: Conjunctivae, EOM and lids are normal. Pupils are equal,  round, and reactive to light. Right eye exhibits no discharge. Left eye exhibits no discharge. No scleral icterus.  Neck: Trachea normal and normal range of motion. Neck supple. Carotid bruit is not present.  Cardiovascular: Normal rate, regular rhythm, normal heart sounds, intact distal pulses and normal pulses.   No murmur heard. Pulmonary/Chest: Effort normal and breath sounds normal. He has no rhonchi.  Abdominal: Soft. Normal appearance and bowel sounds are normal. He exhibits no abdominal bruit. There is no tenderness.  Musculoskeletal: Normal range of motion. He exhibits no edema, tenderness or deformity.  Lymphadenopathy:       Head (right side): No submental, no submandibular, no tonsillar, no preauricular, no posterior auricular and no occipital adenopathy present.       Head (left side): No submental, no submandibular, no tonsillar, no preauricular, no posterior auricular and no occipital adenopathy present.    He has no cervical adenopathy.  Neurological: He is alert and oriented to person, place, and time. He has normal strength and normal reflexes. He is not disoriented. He displays normal reflexes. No cranial nerve deficit or sensory deficit. He exhibits normal muscle tone. Coordination and gait normal. GCS eye subscore is 4. GCS verbal subscore is 5. GCS motor subscore is 6.  Skin: Skin is warm, dry and intact. No lesion and no rash noted.  Psychiatric: He has a normal mood and affect. His speech is normal and behavior is normal. Judgment and thought content normal.    BP 128/84 (BP Location: Left Arm, Patient Position: Sitting, Cuff Size: Large)   Pulse 84   Temp 98.1 F (36.7 C) (Oral)   Resp 16   Ht 6\' 4"  (1.93 m)   Wt (!) 375 lb 6.4  oz (170.3 kg)   SpO2 98%   BMI 45.70 kg/m   Labs:  Glucose: 1.005, Protein -, Blood -, Sugar -   Assessment:    Healthy male exam.  Meets standards in 4049 CFR 391.41;  qualifies for 2 year certificate.    Plan:    Medical  examiners certificate completed and printed. Return as needed.

## 2016-09-16 NOTE — Patient Instructions (Signed)
     IF you received an x-ray today, you will receive an invoice from [facility]. Please contact [facility] at [phone] with questions or concerns regarding your invoice.   IF you received labwork today, you will receive an invoice from [facility]. Please contact [facility] at [phone] with questions or concerns regarding your invoice.   Our billing staff will not be able to assist you with questions regarding bills from these companies.  You will be contacted with the lab results as soon as they are available. The fastest way to get your results is to activate your My Chart account. Instructions are located on the last page of this paperwork. If you have not heard from us regarding the results in 2 weeks, please contact this office.      

## 2016-09-18 ENCOUNTER — Emergency Department
Admission: EM | Admit: 2016-09-18 | Discharge: 2016-09-18 | Disposition: A | Discharge status: Home / Self Care | Payer: Self-pay | Attending: Emergency Medicine | Admitting: Emergency Medicine

## 2016-09-18 ENCOUNTER — Emergency Department: Payer: Self-pay

## 2016-09-18 DIAGNOSIS — S8392XA Sprain of unspecified site of left knee, initial encounter: Secondary | ICD-10-CM | POA: Insufficient documentation

## 2016-09-18 DIAGNOSIS — X501XXA Overexertion from prolonged static or awkward postures, initial encounter: Secondary | ICD-10-CM | POA: Insufficient documentation

## 2016-09-18 DIAGNOSIS — Y92009 Unspecified place in unspecified non-institutional (private) residence as the place of occurrence of the external cause: Secondary | ICD-10-CM | POA: Insufficient documentation

## 2016-09-18 DIAGNOSIS — Y999 Unspecified external cause status: Secondary | ICD-10-CM | POA: Insufficient documentation

## 2016-09-18 DIAGNOSIS — Y9389 Activity, other specified: Secondary | ICD-10-CM | POA: Insufficient documentation

## 2016-09-18 MED ORDER — HYDROCODONE-ACETAMINOPHEN 5-325 MG PO TABS
1.0000 | ORAL_TABLET | Freq: Once | ORAL | Status: AC
  Administered 2016-09-18: 1 via ORAL
  Filled 2016-09-18: qty 1

## 2016-09-18 MED ORDER — HYDROCODONE-ACETAMINOPHEN 5-325 MG PO TABS: 1.0000 | ORAL_TABLET | Freq: Four times a day (QID) | ORAL | 0 refills | Status: DC | PRN

## 2016-09-18 NOTE — Discharge Instructions (Signed)
Please go to a local medical supply store to get crutches that would be safe for your height and weight.
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
## 2017-03-10 IMAGING — US US SCROTUM
1 series · 13 of 25 positions shown · non-contrast
Comparison: Abdomen ultrasound 12/15/2005

ADDENDUM:
Critical Value/emergent results were called by telephone at the time
of interpretation on 07/24/2016 at 9149 hours to Dr. KANIME VANLILL ,
who verbally acknowledged these results.
CLINICAL DATA: 30-year-old male with 5 hours of right testicular
pain. Initial encounter.

EXAM:
SCROTAL ULTRASOUND
DOPPLER ULTRASOUND OF THE TESTICLES
TECHNIQUE: Complete ultrasound examination of the testicles, epididymis, and
other scrotal structures was performed. Color and spectral Doppler
ultrasound were also utilized to evaluate blood flow to the
testicles.

[Series 1: us scrotum · 0.10mm/px · 13 of 58 slices shown]
[im 1/58]
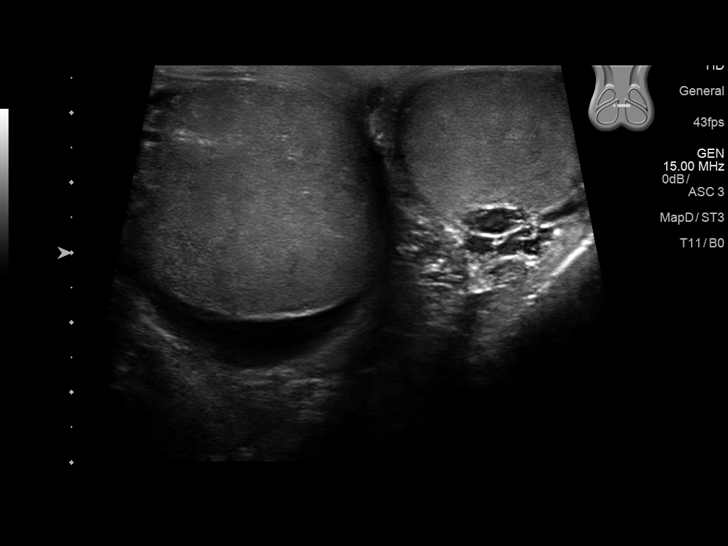
[im 5/58]
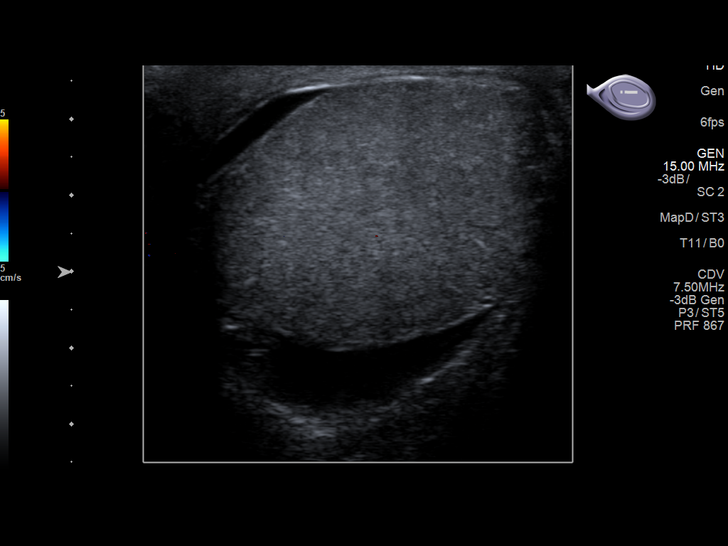
[im 10/58]
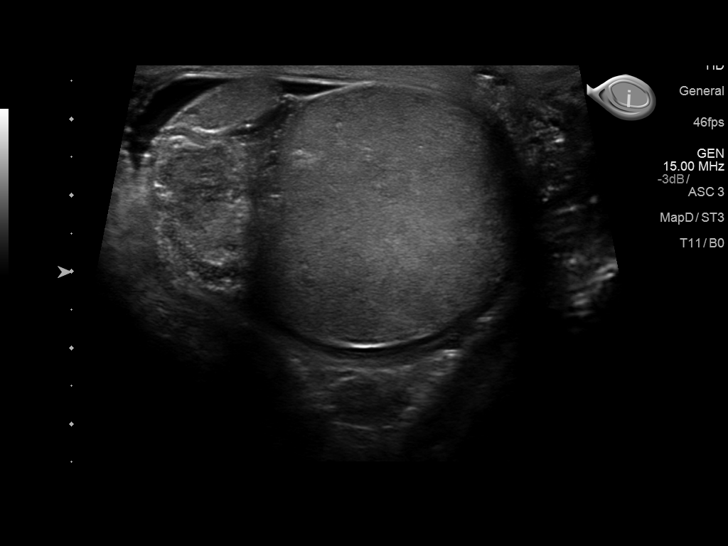
[im 15/58]
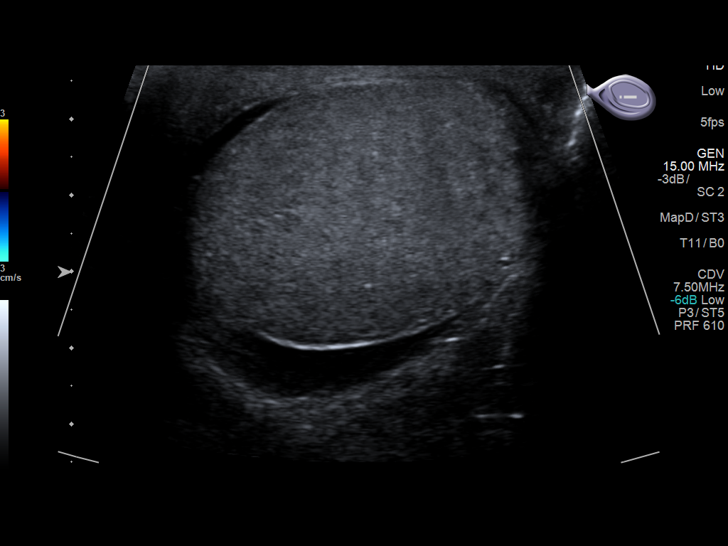
[im 20/58]
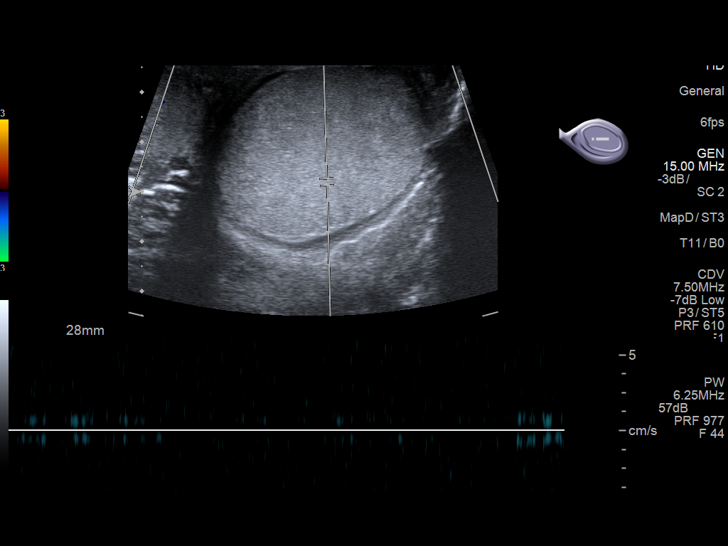
[im 24/58]
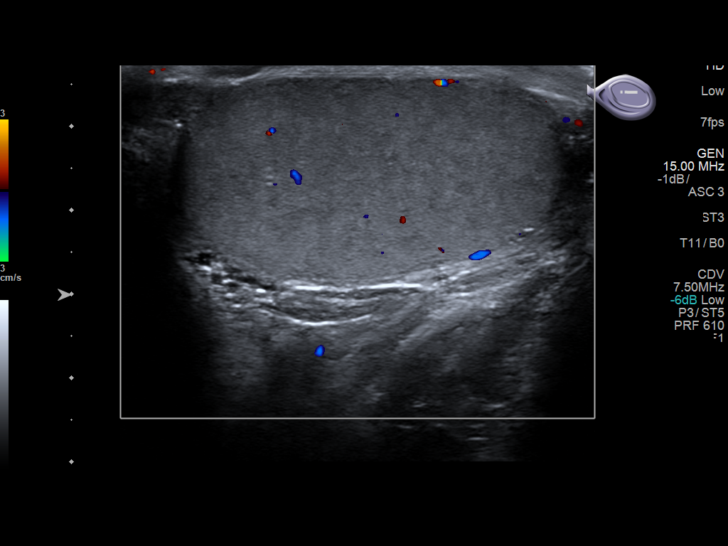
[im 29/58]
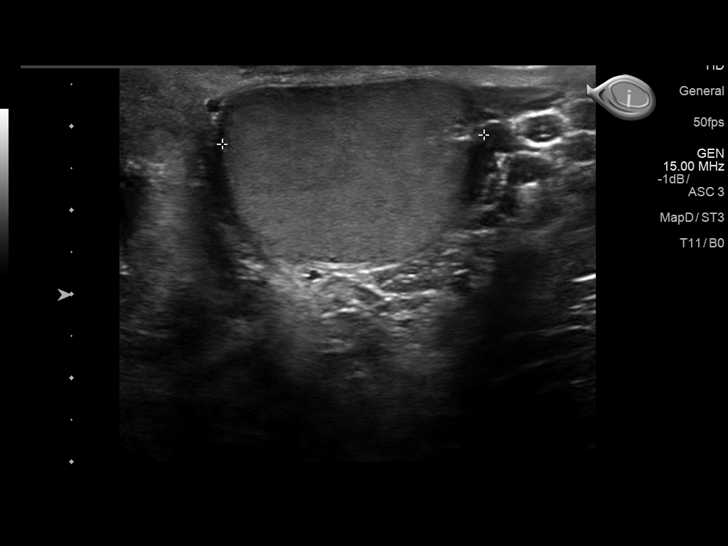
[im 34/58]
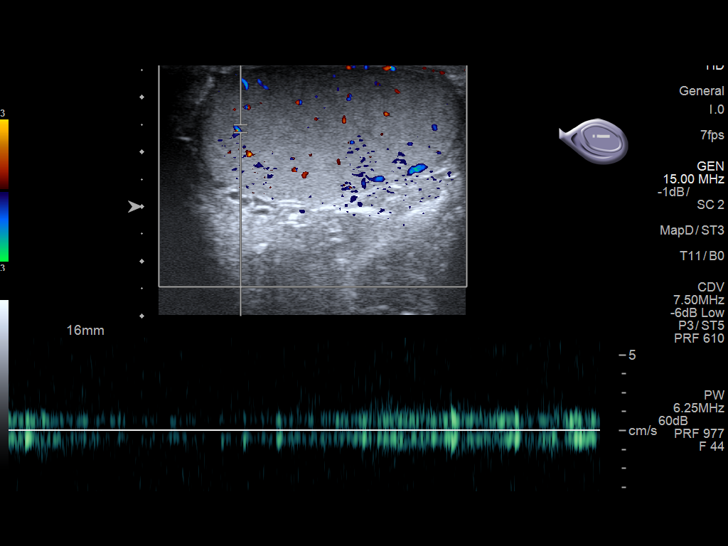
[im 39/58]
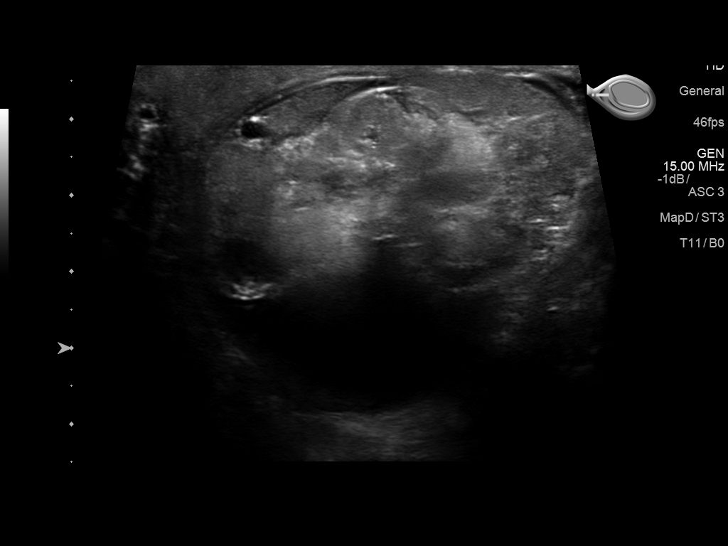
[im 43/58]
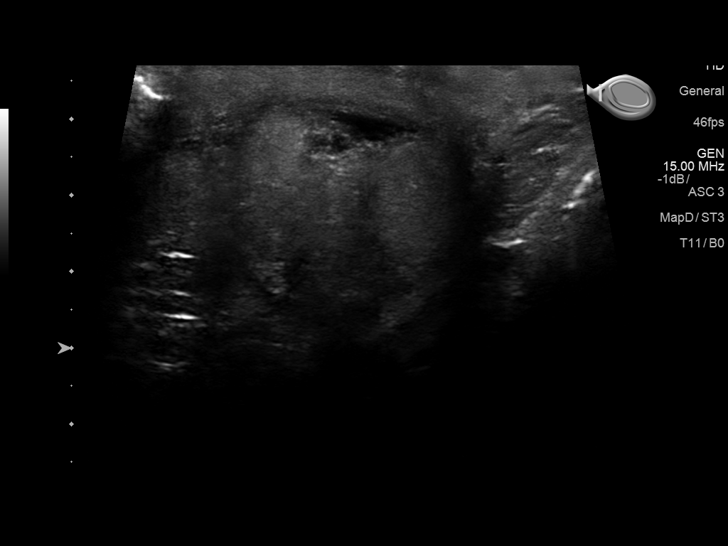
[im 48/58]
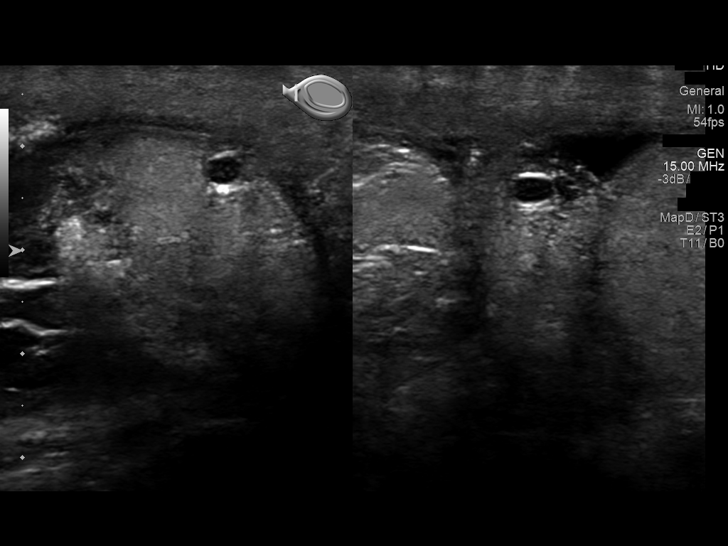
[im 53/58]
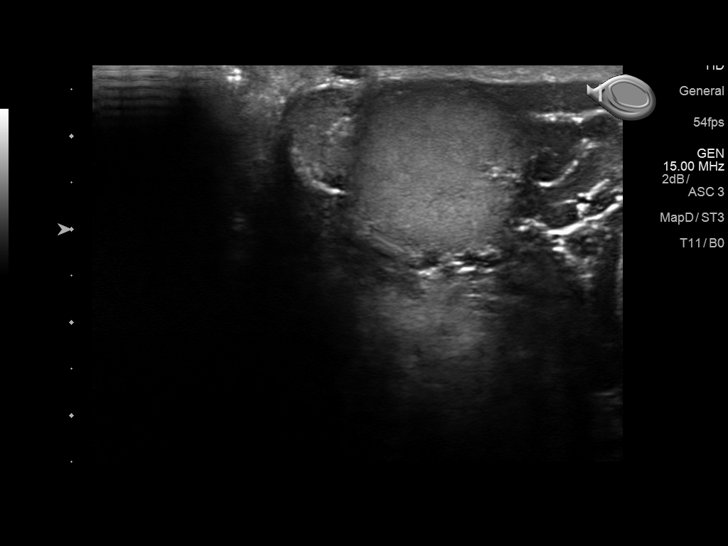
[im 58/58]
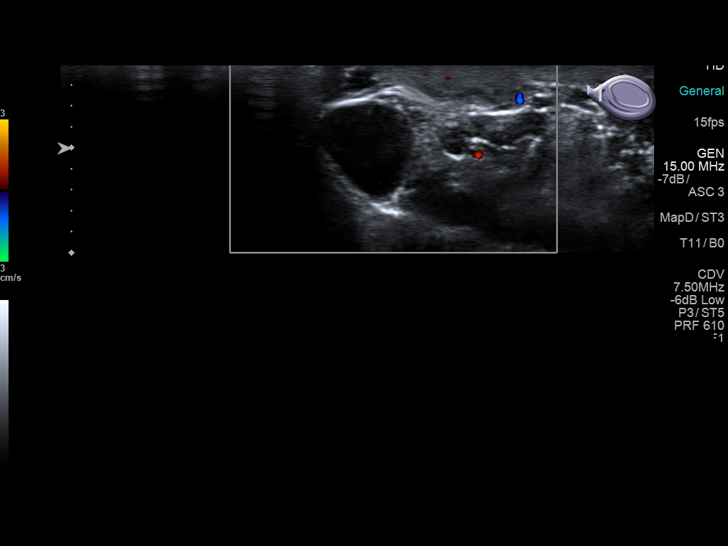

[13 of 25 positions shown; findings below may reference images not displayed]

FINDINGS: Right testicle

Measurements: 4.5 x 3.3 x 3.4 cm. Echogenicity remains within normal
limits.

Left testicle

Measurements: 4.7 x 2.4 by 3.1 cm. No mass or microlithiasis
visualized.

Right epididymis: Normal in size and appearance. Absent blood flow
(image 43).

Left epididymis:  Normal in size and appearance.

Hydrocele:  Small simple appearing right hydrocele.

Varicocele:  None visualized.

Pulsed Doppler interrogation demonstrates absent color or power
Doppler flow in the right testicle (image 6, 17) and epididymis.
Preserved vascularity with normal appearing waveforms in the left
testicle.
IMPRESSION: 1. Positive for right testicular torsion. Absent blood flow to the
right testicle and epididymis. Echogenicity of the right testicle
remains normal. Small simple appearing right hydrocele.
2. Normal left testicle.
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
## 2017-11-27 ENCOUNTER — Emergency Department
Admission: EM | Admit: 2017-11-27 | Discharge: 2017-11-27 | Disposition: A | Discharge status: Home / Self Care | Payer: Self-pay | Attending: Emergency Medicine | Admitting: Emergency Medicine

## 2017-11-27 ENCOUNTER — Other Ambulatory Visit: Payer: Self-pay

## 2017-11-27 ENCOUNTER — Emergency Department: Payer: Self-pay

## 2017-11-27 ENCOUNTER — Encounter: Payer: Self-pay | Admitting: *Deleted

## 2017-11-27 DIAGNOSIS — R200 Anesthesia of skin: Secondary | ICD-10-CM | POA: Insufficient documentation

## 2017-11-27 DIAGNOSIS — R079 Chest pain, unspecified: Secondary | ICD-10-CM | POA: Insufficient documentation

## 2017-11-27 DIAGNOSIS — Z5321 Procedure and treatment not carried out due to patient leaving prior to being seen by health care provider: Secondary | ICD-10-CM | POA: Insufficient documentation

## 2017-11-27 LAB — BASIC METABOLIC PANEL
ANION GAP: 10 (ref 5–15)
BUN: 10 mg/dL (ref 6–20)
CALCIUM: 8.9 mg/dL (ref 8.9–10.3)
CO2: 23 mmol/L (ref 22–32)
Chloride: 106 mmol/L (ref 101–111)
Creatinine, Ser: 0.99 mg/dL (ref 0.61–1.24)
GLUCOSE: 117 mg/dL — AB (ref 65–99)
Potassium: 3.6 mmol/L (ref 3.5–5.1)
Sodium: 139 mmol/L (ref 135–145)

## 2017-11-27 LAB — CBC
HCT: 50.1 % (ref 40.0–52.0)
HEMOGLOBIN: 16.9 g/dL (ref 13.0–18.0)
MCH: 32.3 pg (ref 26.0–34.0)
MCHC: 33.7 g/dL (ref 32.0–36.0)
MCV: 95.8 fL (ref 80.0–100.0)
PLATELETS: 218 10*3/uL (ref 150–440)
RBC: 5.23 MIL/uL (ref 4.40–5.90)
RDW: 13.1 % (ref 11.5–14.5)
WBC: 13.9 10*3/uL — ABNORMAL HIGH (ref 3.8–10.6)

## 2017-11-27 LAB — TROPONIN I

## 2017-11-27 NOTE — ED Notes (Signed)
No answer when called from lobby 

## 2017-11-27 NOTE — ED Triage Notes (Signed)
Pt reports abd pain earlier today, took a nap, woke up with left arm numbness and chest pain. No sob.  No n/v/d.  Pt alert.  Speech clear.

## 2017-11-28 ENCOUNTER — Telehealth: Payer: Self-pay | Admitting: Emergency Medicine
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
## 2018-12-31 IMAGING — CR DG CHEST 2V
2 series · 2 of 2 positions shown · non-contrast
Comparison: None.

CLINICAL DATA: Abdominal pain.  Left arm numbness and chest pain.

EXAM:
CHEST  2 VIEW

[chest pa]
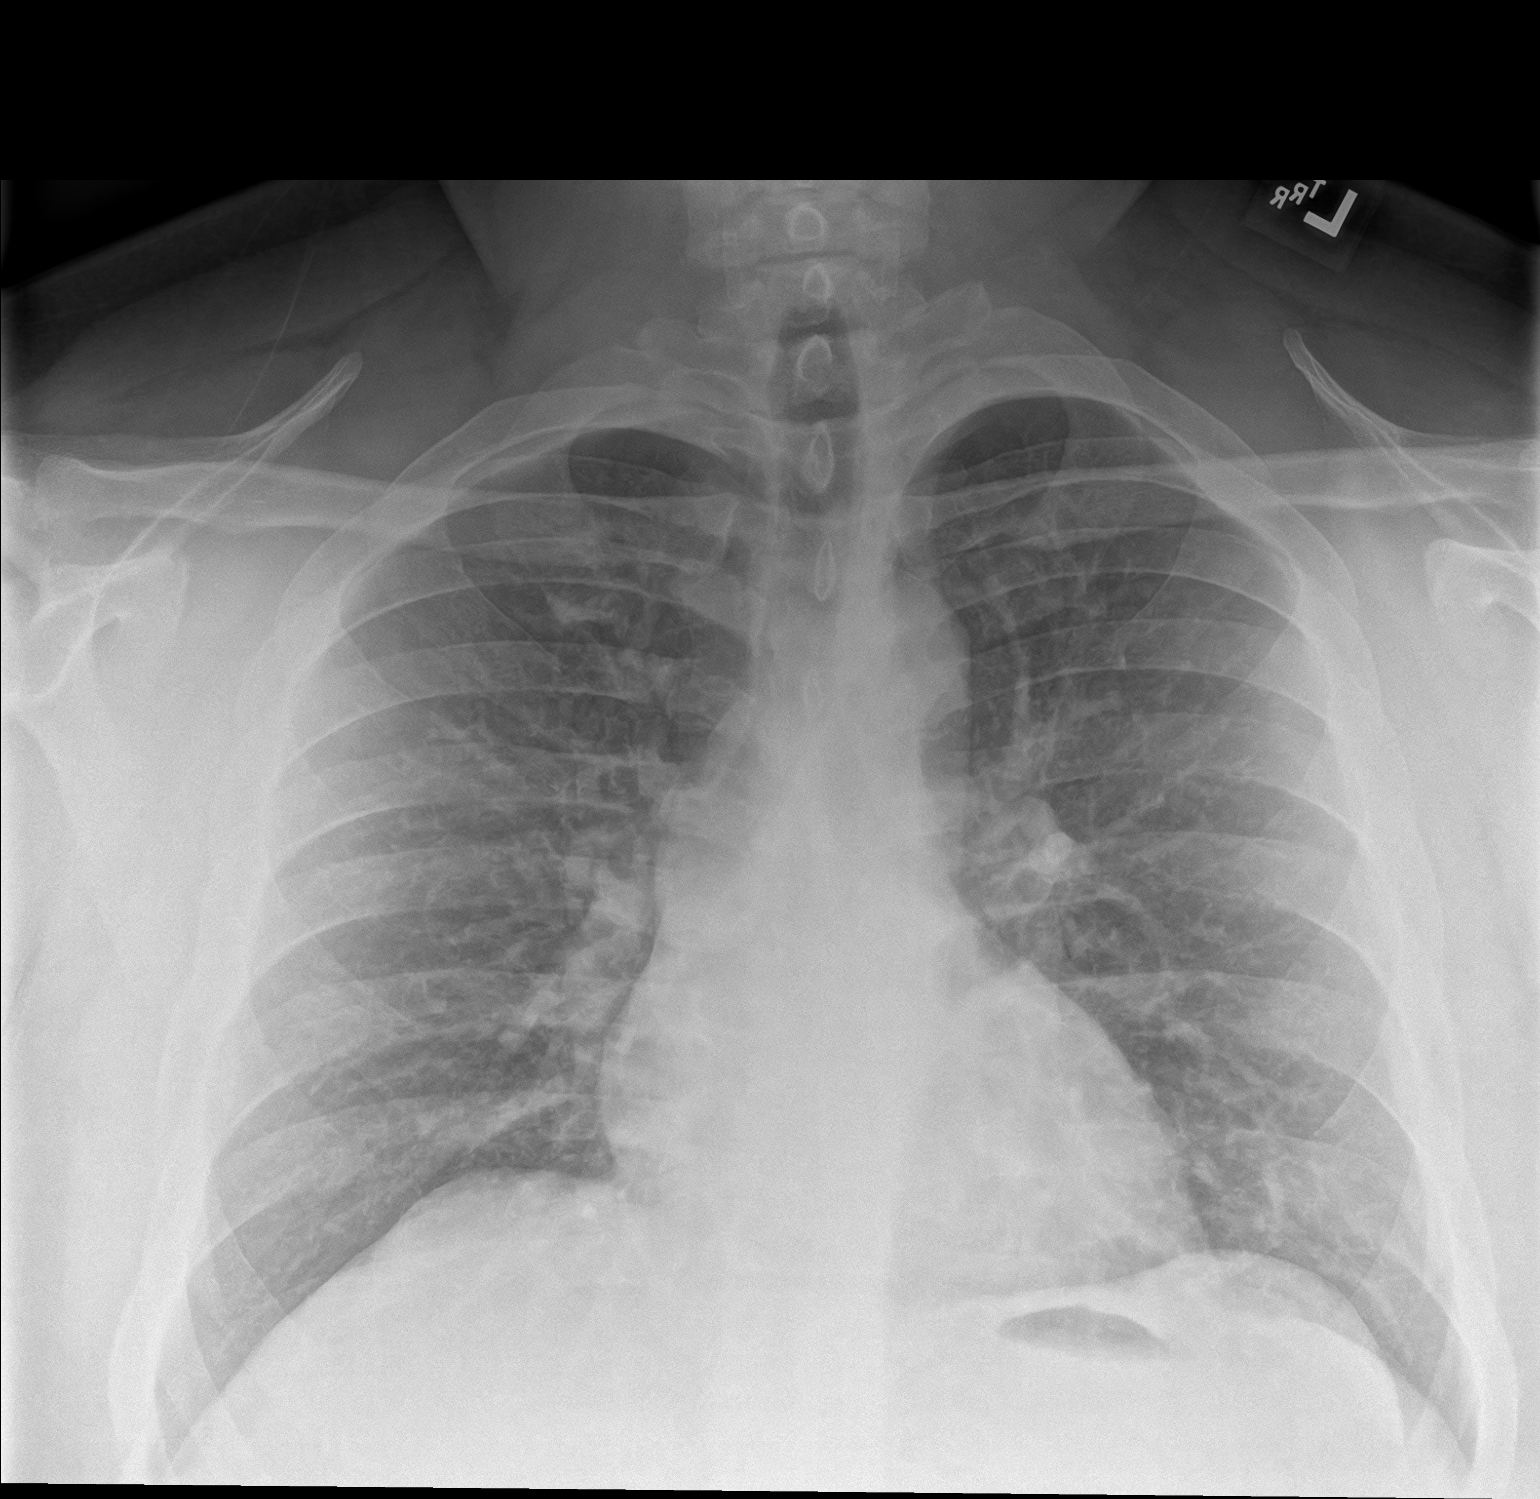

[chest lat]
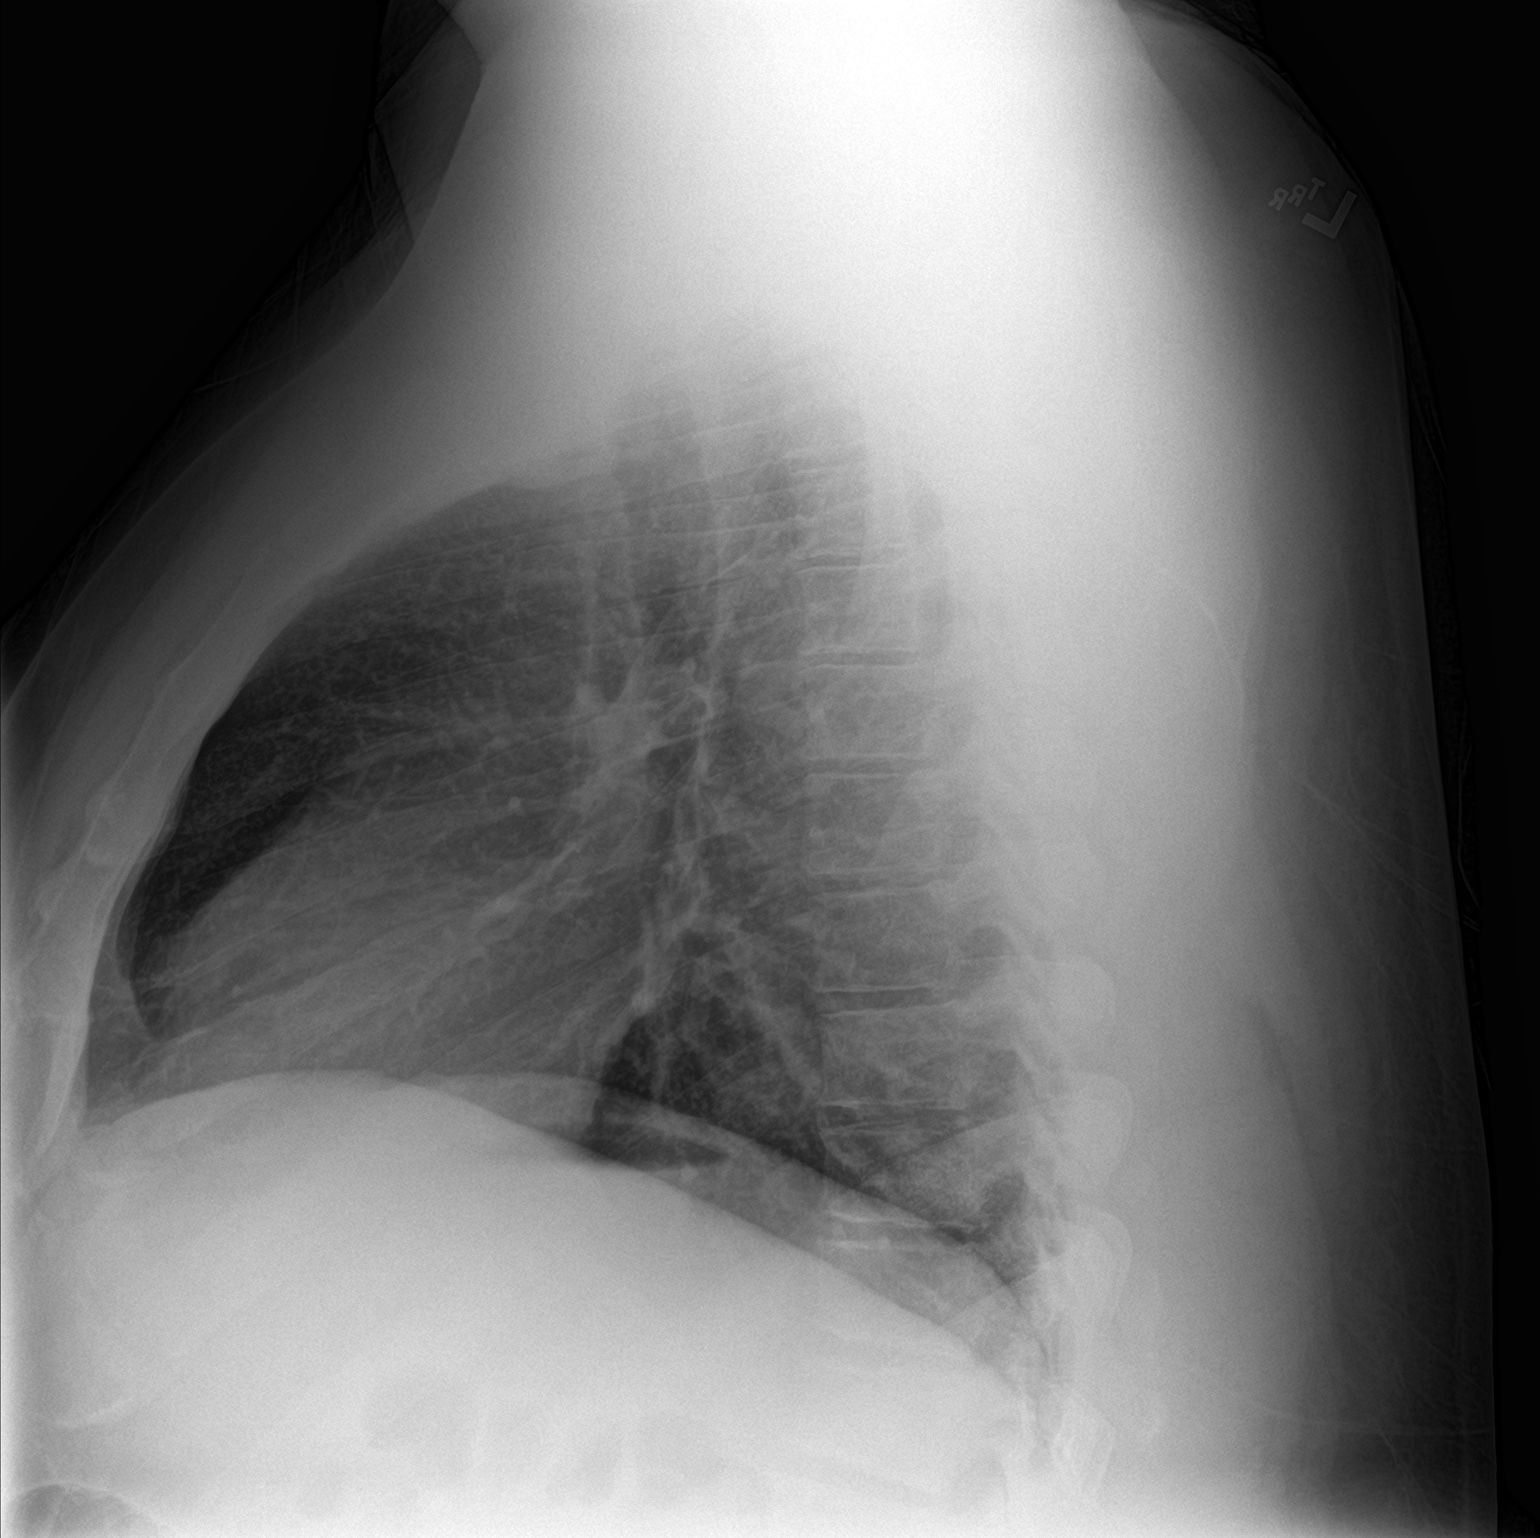

[2 of 2 positions shown; findings below may reference images not displayed]

FINDINGS: The heart size and mediastinal contours are within normal limits.
Both lungs are clear. The visualized skeletal structures are
unremarkable.
IMPRESSION: No active cardiopulmonary disease.

## 2022-06-09 ENCOUNTER — Ambulatory Visit (LOCAL_COMMUNITY_HEALTH_CENTER): Payer: Self-pay

## 2022-06-09 DIAGNOSIS — Z23 Encounter for immunization: Secondary | ICD-10-CM

## 2022-06-09 NOTE — Progress Notes (Signed)
In nurse clinic for Tdap today. Pt explains he stepped on nail 2 days ago and injured big toe. Cannot remember last tetanus vaccine and no Tdap listed in [facility] or [facility]. Tdap given today. Tolerated well. Updated [facility] copy given. RN counseled pt regarding signs of infection and advised to seek medical attn if needed. Jerel Shepherd, RN

## 2023-01-25 ENCOUNTER — Ambulatory Visit: Payer: Self-pay | Admitting: *Deleted

## 2023-01-25 NOTE — Telephone Encounter (Signed)
  Chief Complaint: elevated BP ,new patient  Symptoms: 01/24/23 BP 168/122 in am checked with a relatives BP monitor. Patient reports he is 6'4 380- 400 lbs. Unsure if monitor cuff fits  arm appropriately .  Frequency: yesterday  Pertinent Negatives: Patient denies chest pain no difficulty breathing no fever. No headache N/T no blurred vision no dizziness no neurological deficits reported.  Disposition: [x] ED /[] Urgent Care (no appt availability in office) / [] Appointment(In office/virtual)/ []  [place] [facility]/ [] Home Care/ [] Refused Recommended Disposition /[] [place] [facility]/ []  Follow-up with PCP Additional Notes:   Recommended to recheck BP and try a pharmacy or UC/ED due to needed appropriate size cuff. Patient verbalized understanding . Please advise if earlier new patient appt can be established. Before 03/22/23   Reason for Disposition  [id number] Systolic BP  >= A999333 OR Diastolic >= 123456 AND A999333 having NO cardiac or neurologic symptoms  Answer Assessment - Initial Assessment Questions 1. BLOOD PRESSURE: "What is the blood pressure?" "Did you take at least two measurements 5 minutes apart?"     BP 168/122 on 01/24/23 in am . Does not have BP monitor to check now. 2. ONSET: "When did you take your blood pressure?"     01/24/23 3. HOW: "How did you take your blood pressure?" (e.g., automatic home BP monitor, visiting nurse)     Relative BP monitor 4. HISTORY: "Do you have a history of high blood pressure?"      Na  5. MEDICINES: "Are you taking any medicines for blood pressure?" "Have you missed any doses recently?"     None  6. OTHER SYMPTOMS: "Do you have any symptoms?" (e.g., blurred vision, chest pain, difficulty breathing, headache, weakness)     none 7. PREGNANCY: "Is there any chance you are pregnant?" "When was your last menstrual period?"     na  Protocols used: Blood Pressure - High-A-AH

## 2023-01-26 ENCOUNTER — Other Ambulatory Visit: Payer: Self-pay | Admitting: Family Medicine

## 2023-01-26 MED ORDER — AMLODIPINE BESYLATE 5 MG PO TABS: ORAL_TABLET | ORAL | 0 refills | Status: DC

## 2023-03-22 ENCOUNTER — Ambulatory Visit: Payer: Self-pay | Admitting: Family Medicine

## 2023-04-25 ENCOUNTER — Telehealth: Payer: Self-pay | Admitting: Family Medicine

## 2023-04-26 ENCOUNTER — Ambulatory Visit: Payer: Self-pay | Admitting: Family Medicine

## 2023-04-26 ENCOUNTER — Encounter: Payer: Self-pay | Admitting: Family Medicine

## 2023-04-26 VITALS — BP 209/140 | HR 89 | Temp 98.6°F | Resp 14 | Ht 77.0 in | Wt >= 6400 oz

## 2023-04-26 DIAGNOSIS — Z7689 Persons encountering health services in other specified circumstances: Secondary | ICD-10-CM | POA: Insufficient documentation

## 2023-04-26 DIAGNOSIS — I10 Essential (primary) hypertension: Secondary | ICD-10-CM | POA: Insufficient documentation

## 2023-04-26 MED ORDER — LOSARTAN POTASSIUM-HCTZ 50-12.5 MG PO TABS: 2.0000 | ORAL_TABLET | Freq: Every day | ORAL | 3 refills | Status: DC

## 2023-04-26 NOTE — Progress Notes (Signed)
I,[name],acting as a Neurosurgeon for [name], [name].,have documented all relevant documentation on the behalf of [name], [name],as directed by  [name], [name] while in the presence of [name], [name].  New patient visit   Patient: Phillip Guerrero   DOB: [birth year]   37 y.o. Male  MRN: [id number] Visit Date: 04/26/2023  Today's healthcare provider: [name], [name]   Patient presents for new patient visit to establish care.  Introduced to Publishing rights manager role and practice setting.  All questions answered.  Discussed provider/patient relationship and expectations.   Subjective    Phillip Guerrero is a 37 y.o. male who presents today as a new patient to establish care.  HPI HPI   Patient states to have not had a PCP, is here to establish care today and to manage high BP. Reports to not be taking any medications, Last edited by Lubertha Basque, CMA on 04/26/2023  3:00 PM.       Hypertension: Patient is here for evaluation of elevated blood pressures.  Age at onset of elevated blood pressure:  31.Cardiac symptoms  headaches, 2/week . Patient denies chest pain, chest pressure/discomfort, claudication, dyspnea, exertional chest pressure/discomfort, fatigue, irregular heart beat, lower extremity edema, near-syncope, orthopnea, palpitations, paroxysmal nocturnal dyspnea, syncope, and tachypnea.  Cardiovascular risk factors: hypertension, male gender, obesity (BMI >= 30 kg/m2), and sedentary lifestyle. Use of agents associated with hypertension: none. History of target organ damage: none.   Past Medical History:  Diagnosis Date   Hypertension    Past Surgical History:  Procedure Laterality Date   FRACTURE SURGERY     TESTICLE TORSION REDUCTION N/A 07/24/2016   Procedure: TESTICULAR TORSION REPAIR;  Surgeon: Malen Gauze, MD;  Location: [facility] ORS;  Service: Urology;  Laterality: N/A;   Family Status  Relation Name Status   Mother  Alive   Father  Alive   Sister   Alive   Brother  Alive   MGF  (Not Specified)   Family History  Problem Relation Age of Onset   Hypertension Mother    Diabetes Maternal Grandfather    Social History   Socioeconomic History   Marital status: Single    Spouse name: Not on file   Number of children: Not on file   Years of education: Not on file   Highest education level: Not on file  Occupational History   Not on file  Tobacco Use   Smoking status: Never   Smokeless tobacco: Never  Substance and Sexual Activity   Alcohol use: Yes    Comment: social   Drug use: No   Sexual activity: Not on file  Other Topics Concern   Not on file  Social History Narrative   Not on file   Social Determinants of Health   Financial Resource Strain: Not on file  Food Insecurity: Not on file  Transportation Needs: Not on file  Physical Activity: Not on file  Stress: Not on file  Social Connections: Not on file   Outpatient Medications Prior to Visit  Medication Sig   [DISCONTINUED] amLODipine (NORVASC) 5 MG tablet Take 1 tablet daily for 2 weeks; On start of 3rd week increase to 2 tablets daily. Please keep appt. (Patient not taking: Reported on 04/26/2023)   [DISCONTINUED] HYDROcodone-acetaminophen (NORCO) 5-325 MG tablet Take 1 tablet by mouth every 6 (six) hours as needed for severe pain. (Patient not taking: Reported on 06/09/2022)   No facility-administered medications prior to  visit.   No Known Allergies  Immunization History  Administered Date(s) Administered   DTaP 02/28/1989, 05/13/1991   HIB (PRP-OMP) 05/13/1991   Hepatitis B, PED/ADOLESCENT 07/27/1998, 08/24/1998, 01/25/1999   MMR 02/28/1989, 01/20/1992   Tdap 06/09/2022    Health Maintenance  Topic Date Due   COVID-19 Vaccine (1) Never done   HIV Screening  Never done   Hepatitis C Screening  Never done   INFLUENZA VACCINE  05/25/2023   DTaP/Tdap/Td (4 - Td or Tdap) 06/09/2032   HPV VACCINES  Aged Out    Patient Care Team: [name], [name] as  PCP - General (Family Medicine)  Review of Systems   Objective    BP (!) 209/140 (BP Location: Left Arm, Patient Position: Sitting, Cuff Size: Large)   Pulse 89   Temp 98.6 F (37 C) (Oral)   Resp 14   Ht 6\' 5"  (1.956 m)   Wt (!) 407 lb 14.4 oz (185 kg)   SpO2 98%   BMI 48.37 kg/m   Physical Exam Vitals and nursing note reviewed.  Constitutional:      Appearance: Normal appearance. He is obese.  HENT:     Head: Normocephalic and atraumatic.  Cardiovascular:     Rate and Rhythm: Normal rate and regular rhythm.     Pulses: Normal pulses.     Heart sounds: Normal heart sounds.  Pulmonary:     Effort: Pulmonary effort is normal.     Breath sounds: Normal breath sounds.  Musculoskeletal:        General: Normal range of motion.     Cervical back: Normal range of motion.  Skin:    General: Skin is warm and dry.     Capillary Refill: Capillary refill takes less than 2 seconds.  Neurological:     General: No focal deficit present.     Mental Status: He is alert and oriented to person, place, and time. Mental status is at baseline.  Psychiatric:        Mood and Affect: Mood normal.        Behavior: Behavior normal.        Thought Content: Thought content normal.        Judgment: Judgment normal.     Depression Screen    09/16/2016    9:37 AM  PHQ 2/9 Scores  PHQ - 2 Score 0   No results found for any visits on 04/26/23.  Assessment & Plan      Problem List Items Addressed This Visit       Cardiovascular and Mediastinum   Primary hypertension - Primary    Chronic; uncontrolled x 5-6 years Did not start medication at establish appt time in April following recommendations for UC/ED given ongoing HTN Reports hx of "white coat" previously; however, no longer subsides laying down and relaxing BP not at goal; currently without insurance Recommend hyzaar 50-12.5 daily with 1 table x 1-2 weeks; with increase to 2 tablets and 1 month f/u Information provided on DASH  diet       Relevant Medications   losartan-hydrochlorothiazide (HYZAAR) 50-12.5 MG tablet     Other   Establishing care with new doctor, encounter for    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  - Eat a low-fat diet with lots of fruits and vegetables, up to 7-9 servings per day.  - Seatbelts  can save your life. Wear them always.  - Smoke detectors on every level of your home, check batteries every year.  - Eye Doctor - have an eye exam every 1-2 years  - Safe sex - if you may be exposed to STDs, use a condom.  - Alcohol -  If you drink, do it moderately, less than 2 drinks per day.  - Health Care Power of Attorney. Choose someone to speak for you if you are not able.  - Depression is common in our stressful world.If you're feeling down or losing interest in things you normally enjoy, please come in for a visit.  - Violence - If anyone is threatening or hurting you, please call immediately.       Morbid obesity (HCC)    Body mass index is 48.37 kg/m. Discussed importance of healthy weight management Discussed diet and exercise       Return in about 4 weeks (around 05/24/2023).    Leilani Merl, [name], have reviewed all documentation for this visit. The documentation on 04/26/23 for the exam, diagnosis, procedures, and orders are all accurate and complete.  [name], [name]  [facility] [phone] (phone) [phone] (fax)  [facility]

## 2023-04-26 NOTE — Patient Instructions (Signed)
139/89 goal for blood pressure

## 2023-04-26 NOTE — Assessment & Plan Note (Signed)
Body mass index is 48.37 kg/m. Discussed importance of healthy weight management Discussed diet and exercise

## 2023-04-26 NOTE — Assessment & Plan Note (Signed)
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  - Eat a low-fat diet with lots of fruits and vegetables, up to 7-9 servings per day.  - Seatbelts can save your life. Wear them always.  - Smoke detectors on every level of your home, check batteries every year.  - Eye Doctor - have an eye exam every 1-2 years  - Safe sex - if you may be exposed to STDs, use a condom.  - Alcohol -  If you drink, do it moderately, less than 2 drinks per day.  - Health Care Power of Attorney. Choose someone to speak for you if you are not able.  - Depression is common in our stressful world.If you're feeling down or losing interest in things you normally enjoy, please come in for a visit.  - Violence - If anyone is threatening or hurting you, please call immediately.

## 2023-04-26 NOTE — Assessment & Plan Note (Signed)
Chronic; uncontrolled x 5-6 years Did not start medication at establish appt time in April following recommendations for UC/ED given ongoing HTN Reports hx of "white coat" previously; however, no longer subsides laying down and relaxing BP not at goal; currently without insurance Recommend hyzaar 50-12.5 daily with 1 table x 1-2 weeks; with increase to 2 tablets and 1 month f/u Information provided on DASH diet

## 2023-05-30 ENCOUNTER — Encounter: Payer: Self-pay | Admitting: Family Medicine

## 2023-05-30 ENCOUNTER — Ambulatory Visit: Payer: Self-pay | Admitting: Family Medicine

## 2023-05-30 VITALS — BP 174/115 | HR 87 | Ht 76.5 in | Wt >= 6400 oz

## 2023-05-30 DIAGNOSIS — I10 Essential (primary) hypertension: Secondary | ICD-10-CM

## 2023-05-30 MED ORDER — VALSARTAN-HYDROCHLOROTHIAZIDE 320-25 MG PO TABS: 1.0000 | ORAL_TABLET | Freq: Every day | ORAL | 3 refills | Status: DC

## 2023-05-30 NOTE — Assessment & Plan Note (Signed)
Chronic, stable Body mass index is 49.26 kg/m. Continue to recommend balanced, lower carb meals. Smaller meal size, adding snacks. Choosing water as drink of choice and increasing purposeful exercise.

## 2023-05-30 NOTE — Progress Notes (Signed)
Established patient visit   Patient: Phillip Guerrero   DOB: 1986-04-27   37 y.o. Male  MRN: [id number] Visit Date: 05/30/2023  Today's healthcare provider: [name], [name]  Introduced to nurse practitioner role and practice setting.  All questions answered.  Discussed provider/patient relationship and expectations.  Subjective    HPI HPI     Medical Management of Chronic Issues    Additional comments: Taking medications as prescribed with no symptoms.       Last edited by Acey Lav, CMA on 05/30/2023  4:12 PM.      Hypertension, follow-up  BP Readings from Last 3 Encounters:  05/30/23 (!) 174/115  04/26/23 (!) 209/140  11/27/17 (!) 160/112   Wt Readings from Last 3 Encounters:  05/30/23 (!) 410 lb (186 kg)  04/26/23 (!) 407 lb 14.4 oz (185 kg)  11/27/17 (!) 380 lb (172.4 kg)     He was last seen for hypertension 1 months ago.  BP at that visit was 214/118. Management since that visit includes start of hyzaar 50-12.5.  He reports excellent compliance with treatment. He is not having side effects.  He is following a Regular diet. He is not exercising. He does not smoke.  Use of agents associated with hypertension: none.   Outside blood pressures are . Symptoms: No chest pain No chest pressure  No palpitations No syncope  No dyspnea No orthopnea  No paroxysmal nocturnal dyspnea No lower extremity edema   ---------------------------------------------------------------------------------------------------  Medications: Outpatient Medications Prior to Visit  Medication Sig   [DISCONTINUED] losartan-hydrochlorothiazide (HYZAAR) 50-12.5 MG tablet Take 2 tablets by mouth daily.   No facility-administered medications prior to visit.     Objective    BP (!) 174/115 (BP Location: Left Arm, Patient Position: Sitting, Cuff Size: Large)   Pulse 87   Ht 6' 4.5" (1.943 m)   Wt (!) 410 lb (186 kg)   SpO2 97%   BMI 49.26 kg/m   BP Readings from Last 3  Encounters:  05/30/23 (!) 174/115  04/26/23 (!) 209/140  11/27/17 (!) 160/112   Wt Readings from Last 3 Encounters:  05/30/23 (!) 410 lb (186 kg)  04/26/23 (!) 407 lb 14.4 oz (185 kg)  11/27/17 (!) 380 lb (172.4 kg)   SpO2 Readings from Last 3 Encounters:  05/30/23 97%  04/26/23 98%  11/27/17 98%   Physical Exam Vitals and nursing note reviewed.  Constitutional:      Appearance: Normal appearance. He is obese.  HENT:     Head: Normocephalic and atraumatic.  Cardiovascular:     Rate and Rhythm: Normal rate and regular rhythm.     Pulses: Normal pulses.     Heart sounds: Normal heart sounds.  Pulmonary:     Effort: Pulmonary effort is normal.     Breath sounds: Normal breath sounds.  Musculoskeletal:        General: Normal range of motion.     Cervical back: Normal range of motion.  Skin:    General: Skin is warm and dry.     Capillary Refill: Capillary refill takes less than 2 seconds.  Neurological:     General: No focal deficit present.     Mental Status: He is alert and oriented to person, place, and time. Mental status is at baseline.  Psychiatric:        Mood and Affect: Mood normal.        Behavior: Behavior normal.        Thought  Content: Thought content normal.        Judgment: Judgment normal.    No results found for any visits on 05/30/23.  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 Continue to recommend balanced, lower carb meals. Smaller meal size, adding snacks. Choosing water as drink of choice and increasing purposeful exercise.       No follow-ups on file.     Leilani Merl, [name], have reviewed all documentation for this visit. The documentation on 05/30/23 for the exam, diagnosis, procedures, and orders are all accurate and complete.  [name], [name]  [facility] [phone] (phone) [phone] (fax)  [facility]

## 2023-05-30 NOTE — Assessment & Plan Note (Signed)
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

## 2023-06-20 ENCOUNTER — Ambulatory Visit: Payer: Self-pay | Admitting: Family Medicine

## 2023-06-20 DIAGNOSIS — Z113 Encounter for screening for infections with a predominantly sexual mode of transmission: Secondary | ICD-10-CM

## 2023-06-20 LAB — HM HIV SCREENING LAB: HM HIV Screening: NEGATIVE

## 2023-06-20 NOTE — Progress Notes (Signed)

## 2023-06-20 NOTE — Progress Notes (Signed)
[facility] Department STI clinic/screening visit  Subjective:  Phillip Guerrero is a 37 y.o. male being seen today for an STI screening visit. The patient reports they do not have symptoms.    Patient has the following medical conditions:   Patient Active Problem List   Diagnosis Date Noted   Morbid obesity (HCC) 04/26/2023   Primary hypertension 04/26/2023     Chief Complaint  Patient presents with   SEXUALLY TRANSMITTED DISEASE    Review of Symptoms negative.     Patient reports wanting veneral disease screening. They report being asymptomatic at this time.   Last HIV test per patient/review of record was No results found for: "HMHIVSCREEN" No results found for: "HIV"  Does the patient or their partner desires a pregnancy in the next year? No  Screening for MPX risk: Does the patient have an unexplained rash? No Is the patient MSM? No Does the patient endorse multiple sex partners or anonymous sex partners? Yes Did the patient have close or sexual contact with a person diagnosed with MPX? Not assessed.  Has the patient traveled outside the [place] where MPX is endemic? Not assessed.  Is there a high clinical suspicion for MPX-- evidenced by one of the following No  -Unlikely to be chickenpox  -Lymphadenopathy  -Rash that present in same phase of evolution on any given body part   See flowsheet for further details and programmatic requirements.   Immunization History  Administered Date(s) Administered   DTaP 02/28/1989, 05/13/1991   HIB (PRP-OMP) 05/13/1991   Hepatitis B, PED/ADOLESCENT 07/27/1998, 08/24/1998, 01/25/1999   MMR 02/28/1989, 01/20/1992   Tdap 06/09/2022     The following portions of the patient's history were reviewed and updated as appropriate: allergies, current medications, past medical history, past social history, past surgical history and problem list.  Objective:  There were no vitals filed for this visit.  Physical Exam Vitals and  nursing note reviewed.  Constitutional:      Appearance: Normal appearance. He is obese.  HENT:     Head: Normocephalic and atraumatic.     Comments: No nits or hair loss    Mouth/Throat:     Lips: Pink.     Mouth: Mucous membranes are moist. No oral lesions.     Tongue: No lesions.     Pharynx: Oropharynx is clear. No oropharyngeal exudate or posterior oropharyngeal erythema.  Eyes:     General:        Right eye: No discharge.        Left eye: No discharge.     Conjunctiva/sclera:     Right eye: Right conjunctiva is not injected. No exudate.    Left eye: Left conjunctiva is not injected. No exudate. Pulmonary:     Effort: Pulmonary effort is normal.  Genitourinary:    Pubic Area: Pubic lice: no nits.     Rectum: Tenderness: no lesions or discharge.     Comments: EPIC error- declined genital exam- no symptoms Lymphadenopathy:     Head:     Right side of head: No submental, submandibular, tonsillar, preauricular or posterior auricular adenopathy.     Left side of head: No submental, submandibular, tonsillar, preauricular or posterior auricular adenopathy.     Cervical: No cervical adenopathy.     Right cervical: No superficial, deep or posterior cervical adenopathy.    Left cervical: No superficial, deep or posterior cervical adenopathy.  Skin:    General: Skin is warm and dry.  Comments: Exposed areas only.   Neurological:     Mental Status: He is alert and oriented to person, place, and time.  Psychiatric:        Mood and Affect: Mood normal.        Behavior: Behavior normal. Behavior is cooperative.       Assessment and Plan:  Phillip Guerrero is a 37 y.o. male presenting to the [facility] Department for STI screening  1. Screening for venereal disease  - Chlamydia/GC NAA, Confirmation - HIV [place] [facility] - Syphilis Serology,  Lab - Gonococcus culture   Patient does not have STI symptoms Patient accepted all screenings including  urine  GC/Chlamydia, and blood work for HIV/Syphilis. Patient meets criteria for HepB screening? No. Ordered? not applicable Patient meets criteria for HepC screening? No. Ordered? not applicable Recommended condom use with all sex Discussed importance of condom use for STI prevent  Treat positive test results per standing order. Discussed time line for [facility] results and that patient will be called with positive results and encouraged patient to call if he had not heard in 2 weeks Recommended repeat testing in 3 months with positive results. Recommended returning for continued or worsening symptoms.   Return if symptoms worsen or fail to improve.  No future appointments. Total time spent with patient 15 minutes.   Edmonia James, NP

## 2023-06-20 NOTE — Progress Notes (Signed)
Pt is here for std visit, condoms declined. Gaspar Garbe, RN

## 2023-06-23 ENCOUNTER — Ambulatory Visit: Payer: Self-pay

## 2023-06-23 NOTE — Telephone Encounter (Signed)
Patient phone going directly to voicemail. No VM box set up. CRM created. Ok for [facility] to advise if patient returns call

## 2023-06-23 NOTE — Telephone Encounter (Signed)
     Chief Complaint: BP yesterday with home cuff 200/126. No symptoms. Has not checked BP today. Was started on Valsartan 05/30/23."It didn't work so I went back on Losartan."  Asking for medication change.Spoke with Victorino Dike in the practice and will send triage note. Symptoms: None Frequency: Yesterday Pertinent Negatives: Patient denies any symptoms Disposition: [] ED /[] Urgent Care (no appt availability in office) / [] Appointment(In office/virtual)/ []  [place] [facility]/ [] Home Care/ [] Refused Recommended Disposition /[] [place] [facility]/ [x]  Follow-up with PCP Additional Notes: Please advise pt.   Reason for Disposition  [1] Systolic BP  >= 200 OR Diastolic >= 120 AND [2] having NO cardiac or neurologic symptoms  Answer Assessment - Initial Assessment Questions 1. BLOOD PRESSURE: "What is the blood pressure?" "Did you take at least two measurements 5 minutes apart?"     200/126 yesterday  2. ONSET: "When did you take your blood pressure?"     Yesterday 3. HOW: "How did you take your blood pressure?" (e.g., automatic home BP monitor, visiting nurse)     Home cuff 4. HISTORY: "Do you have a history of high blood pressure?"     Yes 5. MEDICINES: "Are you taking any medicines for blood pressure?" "Have you missed any doses recently?"     Yes 6. OTHER SYMPTOMS: "Do you have any symptoms?" (e.g., blurred vision, chest pain, difficulty breathing, headache, weakness)     No 7. PREGNANCY: "Is there any chance you are pregnant?" "When was your last menstrual period?"     N/a  Protocols used: Blood Pressure - High-A-AH

## 2023-06-24 LAB — GONOCOCCUS CULTURE

## 2023-06-25 LAB — CHLAMYDIA/GC NAA, CONFIRMATION
Chlamydia trachomatis, NAA: NEGATIVE
Neisseria gonorrhoeae, NAA: NEGATIVE

## 2023-06-28 NOTE — Telephone Encounter (Signed)
Patient reports he did not go to ED or urgent care for elevated BP. He reports taking losartan-hydrochlorothiazide 50-12.5 mg two tablets daily. He is getting over a cold. He has been taking Mucinex DM and NyQuil. BP today [place] 204/149. He has not taken medication. Scheduled fu for Friday 06/30/23.

## 2023-06-30 ENCOUNTER — Telehealth: Payer: Self-pay

## 2023-06-30 ENCOUNTER — Ambulatory Visit (INDEPENDENT_AMBULATORY_CARE_PROVIDER_SITE_OTHER): Payer: PRIVATE HEALTH INSURANCE | Admitting: Family Medicine

## 2023-06-30 ENCOUNTER — Encounter: Payer: Self-pay | Admitting: Family Medicine

## 2023-06-30 VITALS — BP 192/117 | HR 82 | Ht 76.5 in | Wt >= 6400 oz

## 2023-06-30 DIAGNOSIS — I10 Essential (primary) hypertension: Secondary | ICD-10-CM

## 2023-06-30 MED ORDER — TIRZEPATIDE-WEIGHT MANAGEMENT 2.5 MG/0.5ML ~~LOC~~ SOAJ: 2.5000 mg | SUBCUTANEOUS | 0 refills | Status: DC

## 2023-06-30 MED ORDER — LOSARTAN POTASSIUM-HCTZ 50-12.5 MG PO TABS: 1.0000 | ORAL_TABLET | Freq: Two times a day (BID) | ORAL | 3 refills | Status: DC

## 2023-06-30 MED ORDER — CARVEDILOL 3.125 MG PO TABS: 3.1250 mg | ORAL_TABLET | Freq: Two times a day (BID) | ORAL | 0 refills | Status: DC

## 2023-06-30 MED ORDER — AMLODIPINE BESYLATE 5 MG PO TABS: ORAL_TABLET | ORAL | 0 refills | Status: DC

## 2023-06-30 NOTE — Assessment & Plan Note (Addendum)
Chronic, uncontrolled Reports no improvement in BP since start of diovan Went back to hyzaar to assist Continued emphasis on low sodium diet, weight reduction and regular exercise Will add norvasc 5 with transition to 10 mg and coreg to assist Referral placed to adv htn clinic 1 month f/u recommended  Labs completed for secondary w/u to assist now that pt has insurance as of 9/1

## 2023-06-30 NOTE — Progress Notes (Signed)
Established patient visit   Patient: Phillip Guerrero   DOB: July 31, 1986   37 y.o. Male  MRN: [id number] Visit Date: 06/30/2023  Today's healthcare provider: [name], [name]  Introduced to nurse practitioner role and practice setting.  All questions answered.  Discussed provider/patient relationship and expectations.  Subjective    HPI HPI     Medical Management of Chronic Issues    Additional comments: Follow up on BP, stop taking the Diovan because was ineffective, currently taking Losartan       Last edited by Rolly Salter, CMA on 06/30/2023 11:13 AM.      Reports no effect with diovan 320-25; now taking previously prescribed   Medications: Outpatient Medications Prior to Visit  Medication Sig   [DISCONTINUED] valsartan-hydrochlorothiazide (DIOVAN-HCT) 320-25 MG tablet Take 1 tablet by mouth daily. (Patient not taking: Reported on 06/30/2023)   No facility-administered medications prior to visit.     Objective    BP (!) 192/117 (BP Location: Right Arm, Patient Position: Sitting, Cuff Size: Large)   Pulse 82   Ht 6' 4.5" (1.943 m)   Wt (!) 409 lb 14.4 oz (185.9 kg)   SpO2 97%   BMI 49.24 kg/m   BP Readings from Last 3 Encounters:  06/30/23 (!) 192/117  05/30/23 (!) 174/115  04/26/23 (!) 209/140   Wt Readings from Last 3 Encounters:  06/30/23 (!) 409 lb 14.4 oz (185.9 kg)  05/30/23 (!) 410 lb (186 kg)  04/26/23 (!) 407 lb 14.4 oz (185 kg)    {See vitals history (optional):1 Physical Exam Vitals and nursing note reviewed.  Constitutional:      Appearance: Normal appearance. He is obese.  HENT:     Head: Normocephalic and atraumatic.  Cardiovascular:     Rate and Rhythm: Normal rate and regular rhythm.     Pulses: Normal pulses.     Heart sounds: Normal heart sounds.  Pulmonary:     Effort: Pulmonary effort is normal.     Breath sounds: Normal breath sounds.  Musculoskeletal:        General: Normal range of motion.     Cervical back: Normal range  of motion.     Right lower leg: No edema.     Left lower leg: No edema.  Skin:    General: Skin is warm and dry.     Capillary Refill: Capillary refill takes less than 2 seconds.  Neurological:     General: No focal deficit present.     Mental Status: He is alert and oriented to person, place, and time. Mental status is at baseline.  Psychiatric:        Mood and Affect: Mood normal.        Behavior: Behavior normal.        Thought Content: Thought content normal.        Judgment: Judgment normal.     No results found for any visits on 06/30/23.  Assessment & Plan     Problem List Items Addressed This Visit       Cardiovascular and Mediastinum   Severe uncontrolled hypertension - Primary    Chronic, uncontrolled Reports no improvement in BP since start of diovan Went back to hyzaar to assist Continued emphasis on low sodium diet, weight reduction and regular exercise Will add norvasc 5 with transition to 10 mg and coreg to assist Referral placed to adv htn clinic 1 month f/u recommended  Labs completed for secondary w/u to assist now  that pt has insurance as of 9/1      Relevant Medications   amLODipine (NORVASC) 5 MG tablet   carvedilol (COREG) 3.125 MG tablet   losartan-hydrochlorothiazide (HYZAAR) 50-12.5 MG tablet   Other Relevant Orders   HIV antibody (with reflex)   Hepatitis C Antibody   CBC with Differential/Platelet   Comprehensive Metabolic Panel (CMET)   TSH   Hemoglobin A1c   Lipid panel   Vitamin D (25 hydroxy)   Ambulatory referral to [facility]     Other   Morbid obesity (HCC)    Chronic, Body mass index is 49.24 kg/m. Continue to recommend balanced, lower carb meals. Smaller meal size, adding snacks. Choosing water as drink of choice and increasing purposeful exercise. Will try for zepbound approval now that pt has established insurance as of 9/1      Relevant Medications   tirzepatide (ZEPBOUND) 2.5 MG/0.5ML Pen   Other  Relevant Orders   HIV antibody (with reflex)   Hepatitis C Antibody   CBC with Differential/Platelet   Comprehensive Metabolic Panel (CMET)   TSH   Hemoglobin A1c   Lipid panel   Vitamin D (25 hydroxy)   Ambulatory referral to [facility]   Return in about 4 weeks (around 07/28/2023) for HTN management.     Leilani Merl, [name], have reviewed all documentation for this visit. The documentation on 06/30/23 for the exam, diagnosis, procedures, and orders are all accurate and complete.  [name], [name]  [facility] [phone] (phone) [phone] (fax)  [facility]

## 2023-06-30 NOTE — Telephone Encounter (Signed)
Copied from CRM [id number]. Topic: General - Other >> Jun 30, 2023  3:38 PM Phill Myron wrote: Reason for CRM: are they any discounts codes offered for the medication tirzepatide (ZEPBOUND) 2.5 MG/0.5ML Pen. Please advise

## 2023-06-30 NOTE — Assessment & Plan Note (Addendum)
Chronic, Body mass index is 49.24 kg/m. Continue to recommend balanced, lower carb meals. Smaller meal size, adding snacks. Choosing water as drink of choice and increasing purposeful exercise. Will try for zepbound approval now that pt has established insurance as of 9/1

## 2023-07-01 LAB — HEMOGLOBIN A1C
Est. average glucose Bld gHb Est-mCnc: 117 mg/dL
Hgb A1c MFr Bld: 5.7 % — ABNORMAL HIGH (ref 4.8–5.6)

## 2023-07-01 LAB — COMPREHENSIVE METABOLIC PANEL
ALT: 30 IU/L (ref 0–44)
AST: 42 IU/L — ABNORMAL HIGH (ref 0–40)
Albumin: 4.4 g/dL (ref 4.1–5.1)
Alkaline Phosphatase: 61 IU/L (ref 44–121)
BUN/Creatinine Ratio: 11 (ref 9–20)
BUN: 12 mg/dL (ref 6–20)
Bilirubin Total: 1.8 mg/dL — ABNORMAL HIGH (ref 0.0–1.2)
CO2: 25 mmol/L (ref 20–29)
Calcium: 9.7 mg/dL (ref 8.7–10.2)
Chloride: 101 mmol/L (ref 96–106)
Creatinine, Ser: 1.11 mg/dL (ref 0.76–1.27)
Globulin, Total: 3.1 g/dL (ref 1.5–4.5)
Glucose: 113 mg/dL — ABNORMAL HIGH (ref 70–99)
Potassium: 3.8 mmol/L (ref 3.5–5.2)
Sodium: 141 mmol/L (ref 134–144)
Total Protein: 7.5 g/dL (ref 6.0–8.5)
eGFR: 88 mL/min/{1.73_m2} (ref >59)

## 2023-07-01 LAB — LIPID PANEL
Chol/HDL Ratio: 4.7 ratio (ref 0.0–5.0)
Cholesterol, Total: 151 mg/dL (ref 100–199)
HDL: 32 mg/dL — ABNORMAL LOW (ref >39)
LDL Chol Calc (NIH): 104 mg/dL — ABNORMAL HIGH (ref 0–99)
Triglycerides: 77 mg/dL (ref 0–149)
VLDL Cholesterol Cal: 15 mg/dL (ref 5–40)

## 2023-07-01 LAB — CBC WITH DIFFERENTIAL/PLATELET
Basophils Absolute: 0.1 10*3/uL (ref 0.0–0.2)
Basos: 1 %
EOS (ABSOLUTE): 0.1 10*3/uL (ref 0.0–0.4)
Eos: 1 %
Hematocrit: 46.9 % (ref 37.5–51.0)
Hemoglobin: 15.9 g/dL (ref 13.0–17.7)
Immature Grans (Abs): 0 10*3/uL (ref 0.0–0.1)
Immature Granulocytes: 0 %
Lymphocytes Absolute: 2.7 10*3/uL (ref 0.7–3.1)
Lymphs: 33 %
MCH: 33.2 pg — ABNORMAL HIGH (ref 26.6–33.0)
MCHC: 33.9 g/dL (ref 31.5–35.7)
MCV: 98 fL — ABNORMAL HIGH (ref 79–97)
Monocytes Absolute: 0.6 10*3/uL (ref 0.1–0.9)
Monocytes: 7 %
Neutrophils Absolute: 4.6 10*3/uL (ref 1.4–7.0)
Neutrophils: 58 %
Platelets: 255 10*3/uL (ref 150–450)
RBC: 4.79 x10E6/uL (ref 4.14–5.80)
RDW: 11.9 % (ref 11.6–15.4)
WBC: 8 10*3/uL (ref 3.4–10.8)

## 2023-07-01 LAB — HIV ANTIBODY (ROUTINE TESTING W REFLEX): HIV Screen 4th Generation wRfx: NONREACTIVE

## 2023-07-01 LAB — TSH: TSH: 1.26 u[IU]/mL (ref 0.450–4.500)

## 2023-07-01 LAB — HEPATITIS C ANTIBODY: Hep C Virus Ab: NONREACTIVE

## 2023-07-01 LAB — VITAMIN D 25 HYDROXY (VIT D DEFICIENCY, FRACTURES): Vit D, 25-Hydroxy: 15.6 ng/mL — ABNORMAL LOW (ref 30.0–100.0)

## 2023-07-05 NOTE — Telephone Encounter (Signed)
FYI-Couldn't find any form and call the number in the insurance card and they say they haven't service that company since 2021.  Called patient. NA/Voicemail not set up

## 2023-07-06 ENCOUNTER — Ambulatory Visit (INDEPENDENT_AMBULATORY_CARE_PROVIDER_SITE_OTHER): Payer: PRIVATE HEALTH INSURANCE | Admitting: Family

## 2023-07-06 ENCOUNTER — Encounter (HOSPITAL_BASED_OUTPATIENT_CLINIC_OR_DEPARTMENT_OTHER): Payer: Self-pay | Admitting: Family

## 2023-07-06 VITALS — BP 151/96 | HR 78 | Ht 76.5 in | Wt >= 6400 oz

## 2023-07-06 DIAGNOSIS — Z6841 Body Mass Index (BMI) 40.0 and over, adult: Secondary | ICD-10-CM

## 2023-07-06 DIAGNOSIS — I1A0 Resistant hypertension: Secondary | ICD-10-CM

## 2023-07-06 MED ORDER — CARVEDILOL 6.25 MG PO TABS: 6.2500 mg | ORAL_TABLET | Freq: Two times a day (BID) | ORAL | 11 refills | Status: DC

## 2023-07-06 NOTE — Patient Instructions (Addendum)
Medication Instructions:  Your physician has recommended you make the following change in your medication:     INCREASE Carvedilol to 6.25mg  twice daily Use up your 3.125mg  tablets by taking two tablets twice per day  Labwork: Your physician recommends that you return for lab work today: renin-aldosterone, metanephrines, catecholamines   Testing/Procedures: Your physician has requested that you have a renal artery duplex. During this test, an ultrasound is used to evaluate blood flow to the kidneys. Allow one hour for this exam. Do not eat after midnight the day before and avoid carbonated beverages. Take your medications as you usually do.    Follow-Up: Follow up as scheduled      Special Instructions:   DASH Eating Plan DASH stands for Dietary Approaches to Stop Hypertension. The DASH eating plan is a healthy eating plan that has been shown to: Lower high blood pressure (hypertension). Reduce your risk for type 2 diabetes, heart disease, and stroke. Help with weight loss. What are tips for following this plan? Reading food labels Check food labels for the amount of salt (sodium) per serving. Choose foods with less than 5 percent of the Daily Value (DV) of sodium. In general, foods with less than 300 milligrams (mg) of sodium per serving fit into this eating plan. To find whole grains, look for the word "whole" as the first word in the ingredient list. Shopping Buy products labeled as "low-sodium" or "no salt added." Buy fresh foods. Avoid canned foods and pre-made or frozen meals. Cooking Try not to add salt when you cook. Use salt-free seasonings or herbs instead of table salt or sea salt. Check with your health care provider or pharmacist before using salt substitutes. Do not fry foods. Cook foods in healthy ways, such as baking, boiling, grilling, roasting, or broiling. Cook using oils that are good for your heart. These include olive, canola, avocado, soybean, and sunflower  oil. Meal planning  Eat a balanced diet. This should include: 4 or more servings of fruits and 4 or more servings of vegetables each day. Try to fill half of your plate with fruits and vegetables. 6-8 servings of whole grains each day. 6 or less servings of lean meat, poultry, or fish each day. 1 oz is 1 serving. A 3 oz (85 g) serving of meat is about the same size as the palm of your hand. One egg is 1 oz (28 g). 2-3 servings of low-fat dairy each day. One serving is 1 cup (237 mL). 1 serving of nuts, seeds, or beans 5 times each week. 2-3 servings of heart-healthy fats. Healthy fats called omega-3 fatty acids are found in foods such as walnuts, flaxseeds, fortified milks, and eggs. These fats are also found in cold-water fish, such as sardines, salmon, and mackerel. Limit how much you eat of: Canned or prepackaged foods. Food that is high in trans fat, such as fried foods. Food that is high in saturated fat, such as fatty meat. Desserts and other sweets, sugary drinks, and other foods with added sugar. Full-fat dairy products. Do not salt foods before eating. Do not eat more than 4 egg yolks a week. Try to eat at least 2 vegetarian meals a week. Eat more home-cooked food and less restaurant, buffet, and fast food. Lifestyle When eating at a restaurant, ask if your food can be made with less salt or no salt. If you drink alcohol: Limit how much you have to: 0-1 drink a day if you are male. 0-2 drinks a day  if you are male. Know how much alcohol is in your drink. In the [place] one drink is one 12 oz bottle of beer (355 mL), one 5 oz glass of wine (148 mL), or one 1 oz glass of hard liquor (44 mL). General information Avoid eating more than 2,300 mg of salt a day. If you have hypertension, you may need to reduce your sodium intake to 1,500 mg a day. Work with your provider to stay at a healthy body weight or lose weight. Ask what the best weight range is for you. On most days of the  week, get at least 30 minutes of exercise that causes your heart to beat faster. This may include walking, swimming, or biking. Work with your provider or dietitian to adjust your eating plan to meet your specific calorie needs. What foods should I eat? Fruits All fresh, dried, or frozen fruit. Canned fruits that are in their natural juice and do not have sugar added to them. Vegetables Fresh or frozen vegetables that are raw, steamed, roasted, or grilled. Low-sodium or reduced-sodium tomato and vegetable juice. Low-sodium or reduced-sodium tomato sauce and tomato paste. Low-sodium or reduced-sodium canned vegetables. Grains Whole-grain or whole-wheat bread. Whole-grain or whole-wheat pasta. Brown rice. Orpah Cobb. Bulgur. Whole-grain and low-sodium cereals. Pita bread. Low-fat, low-sodium crackers. Whole-wheat flour tortillas. Meats and other proteins Skinless chicken or [place]. Ground chicken or [place]. Pork with fat trimmed off. Fish and seafood. Egg whites. Dried beans, peas, or lentils. Unsalted nuts, nut butters, and seeds. Unsalted canned beans. Lean cuts of beef with fat trimmed off. Low-sodium, lean precooked or cured meat, such as sausages or meat loaves. Dairy Low-fat (1%) or fat-free (skim) milk. Reduced-fat, low-fat, or fat-free cheeses. Nonfat, low-sodium ricotta or cottage cheese. Low-fat or nonfat yogurt. Low-fat, low-sodium cheese. Fats and oils Soft margarine without trans fats. Vegetable oil. Reduced-fat, low-fat, or light mayonnaise and salad dressings (reduced-sodium). Canola, safflower, olive, avocado, soybean, and sunflower oils. Avocado. Seasonings and condiments Herbs. Spices. Seasoning mixes without salt. Other foods Unsalted popcorn and pretzels. Fat-free sweets. The items listed above may not be all the foods and drinks you can have. Talk to a dietitian to learn more. What foods should I avoid? Fruits Canned fruit in a light or heavy syrup. Fried fruit. Fruit in  cream or butter sauce. Vegetables Creamed or fried vegetables. Vegetables in a cheese sauce. Regular canned vegetables that are not marked as low-sodium or reduced-sodium. Regular canned tomato sauce and paste that are not marked as low-sodium or reduced-sodium. Regular tomato and vegetable juices that are not marked as low-sodium or reduced-sodium. Rosita Fire. Olives. Grains Baked goods made with fat, such as croissants, muffins, or some breads. Dry pasta or rice meal packs. Meats and other proteins Fatty cuts of meat. Ribs. Fried meat. Tomasa Blase. Bologna, salami, and other precooked or cured meats, such as sausages or meat loaves, that are not lean and low in sodium. Fat from the back of a pig (fatback). Bratwurst. Salted nuts and seeds. Canned beans with added salt. Canned or smoked fish. Whole eggs or egg yolks. Chicken or [place] with skin. Dairy Whole or 2% milk, cream, and half-and-half. Whole or full-fat cream cheese. Whole-fat or sweetened yogurt. Full-fat cheese. Nondairy creamers. Whipped toppings. Processed cheese and cheese spreads. Fats and oils Butter. Stick margarine. Lard. Shortening. Ghee. Bacon fat. Tropical oils, such as coconut, palm kernel, or palm oil. Seasonings and condiments Onion salt, garlic salt, seasoned salt, table salt, and sea salt. Worcestershire sauce. Tartar sauce.  Barbecue sauce. Teriyaki sauce. Soy sauce, including reduced-sodium soy sauce. Steak sauce. Canned and packaged gravies. Fish sauce. Oyster sauce. Cocktail sauce. Store-bought horseradish. Ketchup. Mustard. Meat flavorings and tenderizers. Bouillon cubes. Hot sauces. Pre-made or packaged marinades. Pre-made or packaged taco seasonings. Relishes. Regular salad dressings. Other foods Salted popcorn and pretzels. The items listed above may not be all the foods and drinks you should avoid. Talk to a dietitian to learn more. Where to find more information National Heart, Lung, and Blood Institute (NHLBI):  BuffaloDryCleaner.gl American Heart Association (AHA): heart.org Academy of Nutrition and Dietetics: eatright.org National Kidney Foundation (NKF): kidney.org This information is not intended to replace advice given to you by your health care provider. Make sure you discuss any questions you have with your health care provider. Document Revised: 10/27/2022 Document Reviewed: 10/27/2022 Elsevier Patient Education  2024 [employer].

## 2023-07-06 NOTE — Progress Notes (Signed)
Advanced Hypertension Clinic Initial Assessment:    Date:  07/06/2023   ID:  Phillip Guerrero, DOB 1986-10-19, MRN [id number]  PCP:  [name], [name]  Cardiologist:  None  Nephrologist:  Referring MD: [name], [name]   CC: Hypertension  History of Present Illness:    Phillip Guerrero is a 37 y.o. male with a hx of hypertension, obesity, prediabetes, vitamin D deficiency here to establish care in the Advanced Hypertension Clinic.   Referred by PCP 06/2023 due to uncontrolled hypertension. Losartan-hydrochlorothiazide previously switched to valsartan-hydrochlorothiazide but was found to be ineffective. Amlodipine was added at visit 06/30/23. Additionally prescribed Zepbound for weight loss.  Phillip Guerrero was diagnosed with hypertension just a few months ago. Notes it was labile previously. Blood pressure checked with arm cuff at home. Readings have been 151/96, 177 SBP, as high as 200s. he reports tobacco use never. Alcohol use socially. For exercise he has been walking for the last 2-3 weeks for 2 miles. Coaches middle school football. he eats at home and outside of the home and does not follow low sodium diet. 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 Was given price of $1,000 for Zepbound so did not pick up. Reports no snoring, daytime somnolence. Reports no shortness of breath nor dyspnea on exertion. Reports no chest pain, pressure, or tightness. No edema, orthopnea, PND. Reports no palpitations.    Previous antihypertensives: Valsartan-HCTZ - ineffective  Past Medical History:  Diagnosis Date   Hypertension     Past Surgical History:  Procedure Laterality Date   FRACTURE SURGERY     TESTICLE TORSION REDUCTION N/A 07/24/2016   Procedure: TESTICULAR TORSION REPAIR;  Surgeon: Malen Gauze, MD;  Location: [facility] ORS;  Service: Urology;  Laterality: N/A;    Current Medications: Current Meds  Medication Sig   amLODipine (NORVASC) 5 MG tablet Take 1 tablet daily and  increase to 2 tablets after 2 weeks.   losartan-hydrochlorothiazide (HYZAAR) 50-12.5 MG tablet Take 1 tablet by mouth in the morning and at bedtime.   [DISCONTINUED] carvedilol (COREG) 3.125 MG tablet Take 1 tablet (3.125 mg total) by mouth 2 (two) times daily with a meal.     Allergies:   Patient has no known allergies.   Social History   Socioeconomic History   Marital status: Single    Spouse name: Not on file   Number of children: Not on file   Years of education: Not on file   Highest education level: Not on file  Occupational History   Not on file  Tobacco Use   Smoking status: Never   Smokeless tobacco: Never  Substance and Sexual Activity   Alcohol use: Yes    Comment: social   Drug use: No   Sexual activity: Yes    Partners: Female    Birth control/protection: Condom  Other Topics Concern   Not on file  Social History Narrative   Not on file   Social Determinants of Health   Financial Resource Strain: Low Risk  (07/06/2023)   Overall Financial Resource Strain (CARDIA)    Difficulty of Paying Living Expenses: Not hard at all  Food Insecurity: No Food Insecurity (07/06/2023)   Hunger Vital Sign    Worried About Running Out of Food in the Last Year: Never true    Ran Out of Food in the Last Year: Never true  Transportation Needs: No Transportation Needs (07/06/2023)   PRAPARE - Administrator, Civil Service (Medical):  No    Lack of Transportation (Non-Medical): No  Physical Activity: Sufficiently Active (07/06/2023)   Exercise Vital Sign    Days of Exercise per Week: 6 days    Minutes of Exercise per Session: 30 min  Stress: Not on file  Social Connections: Not on file     Family History: The patient's family history includes Diabetes in his maternal grandfather; Hypertension in his mother.  ROS:   Please see the history of present illness.     All other systems reviewed and are negative.  EKGs/Labs/Other Studies Reviewed:    EKG  Interpretation Date/Time:  Thursday July 06 2023 11:02:09 EDT Ventricular Rate:  78 PR Interval:  192 QRS Duration:  104 QT Interval:  384 QTC Calculation: 437 R Axis:   24  Text Interpretation: Normal sinus rhythm Nonspecific T wave abnormality Confirmed by Gillian Shields ([id number]) on 07/06/2023 11:17:06 AM    Recent Labs: 06/30/2023: ALT 30; BUN 12; Creatinine, Ser 1.11; Hemoglobin 15.9; Platelets 255; Potassium 3.8; Sodium 141; TSH 1.260   Recent Lipid Panel    Component Value Date/Time   CHOL 151 06/30/2023 1311   TRIG 77 06/30/2023 1311   HDL 32 (L) 06/30/2023 1311   CHOLHDL 4.7 06/30/2023 1311   LDLCALC 104 (H) 06/30/2023 1311    Physical Exam:   VS:  BP (!) 151/96 Comment: right arm  Pulse 78   Ht 6' 4.5" (1.943 m)   Wt (!) 409 lb (185.5 kg)   BMI 49.14 kg/m  , BMI Body mass index is 49.14 kg/m. GENERAL:  Well appearing, overweight HEENT: Pupils equal round and reactive, fundi not visualized, oral mucosa unremarkable NECK:  No jugular venous distention, waveform within normal limits, carotid upstroke brisk and symmetric, no bruits, no thyromegaly LYMPHATICS:  No cervical adenopathy LUNGS:  Clear to auscultation bilaterally HEART:  RRR.  PMI not displaced or sustained,S1 and S2 within normal limits, no S3, no S4, no clicks, no rubs, no murmurs ABD:  Flat, positive bowel sounds normal in frequency in pitch, no bruits, no rebound, no guarding, no midline pulsatile mass, no hepatomegaly, no splenomegaly EXT:  2 plus pulses throughout, no edema, no cyanosis no clubbing SKIN:  No rashes no nodules NEURO:  Cranial nerves II through XII grossly intact, motor grossly intact throughout PSYCH:  Cognitively intact, oriented to person place and time   ASSESSMENT/PLAN:    HTN - BP not at goal <130/80. Continue Losartan-hydrochlorothiazide 50-12.5mg  BID. Valsartan-hydrochlorothiazide previously ineffective. Continue Amlodipine 5mg  daily, he is hesitant to increase due to  possible side effect of edema. Increase Coreg from 3.125mg  to 6.25mg  BID. Enrolled in [employer] RPM study and BP cuff provided. Secondary workup: No snoring suggestive of OSA 05/2023 normal TSH Labs today: renin-aldosterone, metanephrines, catecholamines Plan for renal duplex to rule out RAS Recommend aiming for 150 minutes of moderate intensity activity per week and following a heart healthy diet.    Obesity - Weight loss via diet and exercise encouraged. Discussed the impact being overweight would have on cardiovascular risk. Recently started walking regimen and encouraged to continue. Zepbound previously cost prohibitive.   Screening for Secondary Hypertension:     07/06/2023   12:54 PM  Causes  Renovascular HTN Screened  Sleep Apnea N/A     - Comments no snoring nor daytime somnolence  Thyroid Disease Screened     - Comments 05/2023 normal TSH  Hyperaldosteronism Screened  Pheochromocytoma Screened  Coarctation of the Aorta N/A     - Comments BP symmetrical  Compliance Screened    Relevant Labs/Studies:    Latest Ref Rng & Units 06/30/2023    1:11 PM 11/27/2017    8:19 PM 07/24/2016    9:40 AM  Basic Labs  Sodium 134 - 144 mmol/L 141  139  139   Potassium 3.5 - 5.2 mmol/L 3.8  3.6  3.9   Creatinine 0.76 - 1.27 mg/dL 1.61  0.96  0.45        Latest Ref Rng & Units 06/30/2023    1:11 PM  Thyroid   TSH 0.450 - 4.500 uIU/mL 1.260                 07/06/2023   11:36 AM  Renovascular   Renal Artery [place] Completed Yes        he consents to be monitored in our remote patient monitoring program through [employer].  he will track his blood pressure twice daily and understands that these trends will help [place] to adjust his medications as needed prior to his next appointment.     Disposition:    FU with MD/PharmD in 1 month    Medication Adjustments/Labs and Tests Ordered: Current medicines are reviewed at length with the patient today.  Concerns regarding medicines are outlined  above.  Orders Placed This Encounter  Procedures   Aldosterone + renin activity w/ ratio   Metanephrines, plasma   Catecholamines, fractionated, plasma   EKG 12-Lead   VAS US RENAL ARTERY DUPLEX   Meds ordered this encounter  Medications   carvedilol (COREG) 6.25 MG tablet    Sig: Take 1 tablet (6.25 mg total) by mouth 2 (two) times daily with a meal.    Dispense:  60 tablet    Refill:  11     Signed, Alver Sorrow, NP  07/06/2023 12:55 PM    [facility]

## 2023-07-19 LAB — ALDOSTERONE + RENIN ACTIVITY W/ RATIO
Aldos/Renin Ratio: 9.5 (ref 0.0–30.0)
Aldosterone: 16.4 ng/dL (ref 0.0–30.0)
Renin Activity, Plasma: 1.718 ng/mL/hr (ref 0.167–5.380)

## 2023-07-19 LAB — METANEPHRINES, PLASMA
Metanephrine, Free: <25 pg/mL (ref 0.0–88.0)
Normetanephrine, Free: 86.7 pg/mL (ref 0.0–210.1)

## 2023-07-19 LAB — CATECHOLAMINES, FRACTIONATED, PLASMA
Dopamine: <30 pg/mL (ref 0–48)
Epinephrine: 48 pg/mL (ref 0–62)
Norepinephrine: 653 pg/mL (ref 0–874)

## 2023-07-27 ENCOUNTER — Encounter (HOSPITAL_BASED_OUTPATIENT_CLINIC_OR_DEPARTMENT_OTHER): Payer: PRIVATE HEALTH INSURANCE

## 2023-08-01 ENCOUNTER — Ambulatory Visit (INDEPENDENT_AMBULATORY_CARE_PROVIDER_SITE_OTHER): Payer: PRIVATE HEALTH INSURANCE | Admitting: Family Medicine

## 2023-08-01 ENCOUNTER — Ambulatory Visit: Payer: Self-pay | Admitting: Family Medicine

## 2023-08-01 DIAGNOSIS — R7303 Prediabetes: Secondary | ICD-10-CM | POA: Diagnosis not present

## 2023-08-01 DIAGNOSIS — I10 Essential (primary) hypertension: Secondary | ICD-10-CM

## 2023-08-01 DIAGNOSIS — E786 Lipoprotein deficiency: Secondary | ICD-10-CM

## 2023-08-01 DIAGNOSIS — E559 Vitamin D deficiency, unspecified: Secondary | ICD-10-CM | POA: Diagnosis not present

## 2023-08-01 DIAGNOSIS — E7800 Pure hypercholesterolemia, unspecified: Secondary | ICD-10-CM

## 2023-08-01 MED ORDER — CARVEDILOL 12.5 MG PO TABS: 12.5000 mg | ORAL_TABLET | Freq: Two times a day (BID) | ORAL | 3 refills | Status: DC

## 2023-08-01 NOTE — Progress Notes (Unsigned)
Established patient visit   Patient: Phillip Guerrero   DOB: 01-01-1986   37 y.o. Male  MRN: [id number] Visit Date: 08/01/2023  Today's healthcare provider: [name], [name]  Introduced to nurse practitioner role and practice setting.  All questions answered.  Discussed provider/patient relationship and expectations.  Subjective    HPI HPI     Hypertension    Additional comments: Patient was seen last month for this.  He was started on Amlodipine 5 mg daily with instruction to increase to 2 daily after 2 weeks, Carvedilol 3.25 mg 1 BID and Valsartan hydrochlorothiazide 320/25 mg daily.  Patient was then seen by cardiology and his medication was changed to: Amlodipine 5 mg daily, Carvedilol 6.25 mg BID and Losartan hydrochlorothiazide 50/12.5 BID       Last edited by Adline Peals, CMA on 08/01/2023  3:24 PM.      The patient, with a history of hypertension, presents for a follow-up visit. He has been working with his doctor and a cardiologist NP from HTN clinic to manage his high blood pressure, which has been gradually decreasing over time. The patient's blood pressure was initially 214/140 and has decreased to 159/100. He has been prescribed losartan, amlodipine, and carvedilol for his hypertension. The patient also mentions having a low-grade temperature of 99 degrees Fahrenheit, but does not feel sick. He has potentially been exposed to a coworker with a cough. The patient discusses his diet, which includes pork and other meats, and expresses a willingness to make changes to improve his health.  Medications: Outpatient Medications Prior to Visit  Medication Sig   amLODipine (NORVASC) 5 MG tablet Take 1 tablet daily and increase to 2 tablets after 2 weeks. (Patient taking differently: Take 5 mg by mouth daily. Take 1 tablet daily and increase to 2 tablets after 2 weeks.)   losartan-hydrochlorothiazide (HYZAAR) 50-12.5 MG tablet Take 1 tablet by mouth in the morning and at  bedtime.   [DISCONTINUED] carvedilol (COREG) 6.25 MG tablet Take 1 tablet (6.25 mg total) by mouth 2 (two) times daily with a meal.   No facility-administered medications prior to visit.      Objective    BP (!) 159/105 (BP Location: Right Arm, Patient Position: Sitting, Cuff Size: Large)   Pulse 86   Temp 99 F (37.2 C) (Oral)   Ht 6' 4.5" (1.943 m)   Wt (!) 406 lb (184.2 kg)   SpO2 95%   BMI 48.78 kg/m   BP Readings from Last 3 Encounters:  08/01/23 (!) 159/105  07/06/23 (!) 151/96  06/30/23 (!) 192/117   Physical Exam Vitals and nursing note reviewed.  Constitutional:      Appearance: Normal appearance. He is obese.  HENT:     Head: Normocephalic and atraumatic.  Cardiovascular:     Rate and Rhythm: Normal rate and regular rhythm.     Pulses: Normal pulses.     Heart sounds: Normal heart sounds.  Pulmonary:     Effort: Pulmonary effort is normal.     Breath sounds: Normal breath sounds.  Musculoskeletal:        General: Normal range of motion.     Cervical back: Normal range of motion.     Right lower leg: No edema.     Left lower leg: No edema.  Skin:    General: Skin is warm and dry.     Capillary Refill: Capillary refill takes less than 2 seconds.  Neurological:  General: No focal deficit present.     Mental Status: He is alert and oriented to person, place, and time. Mental status is at baseline.  Psychiatric:        Mood and Affect: Mood normal.        Behavior: Behavior normal.        Thought Content: Thought content normal.        Judgment: Judgment normal.     No results found for any visits on 08/01/23.  Assessment & Plan    Hypertension Blood pressure improved but still elevated. Discussed the importance of consistent medication use and lifestyle modifications. -Increase Carvedilol to 12.5mg  twice daily; patient has increased from 3.125 BID to 6.25 BID now up to 12.5 mg BID -Continue Losartan-hydrochlorothiazide 50-12.5 and Amlodipine 5 at  current doses; pt continues to decline increase in Amlodipine d/t fear of BLE edema. -Check blood pressure at follow-up appointment with cardiologist.   Prediabetes Elevated HbA1c indicating prediabetes. Discussed the importance of diet and exercise in managing blood glucose levels. -Encourage balanced diet with lean proteins, whole grains, and vegetables. -Encourage regular exercise. -Check HbA1c at follow-up appointment in March 2025.  Hyperlipidemia Low HDL cholesterol and borderline elevated LDL cholesterol. Discussed the importance of diet in managing cholesterol levels. -Encourage diet rich in healthy fats (e.g., olive oil, salmon, avocado) to increase HDL cholesterol. -Check lipid panel at follow-up appointment in March 2025.  Vitamin D Deficiency Low vitamin D levels. Discussed the importance of diet and supplementation in managing vitamin D levels. -Encourage increased intake of vitamin D-rich foods and consider supplementation. -Check vitamin D levels at follow-up appointment in March 2025.     Leilani Merl, [name], have reviewed all documentation for this visit. The documentation on 08/02/23 for the exam, diagnosis, procedures, and orders are all accurate and complete.  [name], [name]  [facility] [phone] (phone) [phone] (fax)  [facility]

## 2023-08-02 ENCOUNTER — Encounter: Payer: Self-pay | Admitting: Family Medicine

## 2023-08-02 DIAGNOSIS — R7303 Prediabetes: Secondary | ICD-10-CM | POA: Insufficient documentation

## 2023-08-02 DIAGNOSIS — E786 Lipoprotein deficiency: Secondary | ICD-10-CM | POA: Insufficient documentation

## 2023-08-02 DIAGNOSIS — E7800 Pure hypercholesterolemia, unspecified: Secondary | ICD-10-CM | POA: Insufficient documentation

## 2023-08-02 DIAGNOSIS — E559 Vitamin D deficiency, unspecified: Secondary | ICD-10-CM | POA: Insufficient documentation

## 2023-08-14 ENCOUNTER — Encounter (HOSPITAL_BASED_OUTPATIENT_CLINIC_OR_DEPARTMENT_OTHER): Payer: PRIVATE HEALTH INSURANCE

## 2023-09-07 ENCOUNTER — Encounter (HOSPITAL_BASED_OUTPATIENT_CLINIC_OR_DEPARTMENT_OTHER): Payer: PRIVATE HEALTH INSURANCE | Admitting: Family

## 2023-09-07 NOTE — Progress Notes (Deleted)
Advanced Hypertension Clinic Initial Assessment:    Date:  09/07/2023   ID:  Phillip Guerrero, DOB Nov 16, 1985, MRN [id number]  PCP:  [name], [name]  Cardiologist:  None  Nephrologist:  Referring MD: [name], [name]   CC: Hypertension  History of Present Illness:    Phillip Guerrero is a 37 y.o. male with a hx of hypertension, obesity, prediabetes, vitamin D deficiency here to follow up in the Advanced Hypertension Clinic.   Referred by PCP 06/2023 due to uncontrolled hypertension. Losartan-hydrochlorothiazide previously switched to valsartan-hydrochlorothiazide but was found to be ineffective. Amlodipine was added at visit 06/30/23. Additionally prescribed Zepbound for weight loss.  Established 07/06/23 after diagnosis of hypertension a few months prior. No prior tobacco use and alcohol use socially. He was walking 2 miles at a time for the prior 2-3 weeks. Carvedilol dose increased. Catecholamines, metanephrines, renin-aldosterone were unremarkable. -  Presents today for follow up. Works as a Manufacturing engineer.   ***needs to schedule renal duplex  Previous antihypertensives: Valsartan-HCTZ - ineffective  Past Medical History:  Diagnosis Date   Hypertension     Past Surgical History:  Procedure Laterality Date   FRACTURE SURGERY     TESTICLE TORSION REDUCTION N/A 07/24/2016   Procedure: TESTICULAR TORSION REPAIR;  Surgeon: Phillip Gauze, MD;  Location: [facility] ORS;  Service: Urology;  Laterality: N/A;    Current Medications: No outpatient medications have been marked as taking for the 09/07/23 encounter (Appointment) with Alver Sorrow, NP.     Allergies:   Patient has no known allergies.   Social History   Socioeconomic History   Marital status: Single    Spouse name: Not on file   Number of children: Not on file   Years of education: Not on file   Highest education level: Not on file  Occupational History   Not on file   Tobacco Use   Smoking status: Never   Smokeless tobacco: Never  Substance and Sexual Activity   Alcohol use: Yes    Comment: social   Drug use: No   Sexual activity: Yes    Partners: Female    Birth control/protection: Condom  Other Topics Concern   Not on file  Social History Narrative   Not on file   Social Determinants of Health   Financial Resource Strain: Low Risk  (07/06/2023)   Overall Financial Resource Strain (CARDIA)    Difficulty of Paying Living Expenses: Not hard at all  Food Insecurity: No Food Insecurity (07/06/2023)   Hunger Vital Sign    Worried About Running Out of Food in the Last Year: Never true    Ran Out of Food in the Last Year: Never true  Transportation Needs: No Transportation Needs (07/06/2023)   PRAPARE - Administrator, Civil Service (Medical): No    Lack of Transportation (Non-Medical): No  Physical Activity: Sufficiently Active (07/06/2023)   Exercise Vital Sign    Days of Exercise per Week: 6 days    Minutes of Exercise per Session: 30 min  Stress: Not on file  Social Connections: Not on file     Family History: The patient's family history includes Diabetes in his maternal grandfather; Hypertension in his mother.  ROS:   Please see the history of present illness.     All other systems reviewed and are negative.  EKGs/Labs/Other Studies Reviewed:         Recent Labs: 06/30/2023: ALT 30;  BUN 12; Creatinine, Ser 1.11; Hemoglobin 15.9; Platelets 255; Potassium 3.8; Sodium 141; TSH 1.260   Recent Lipid Panel    Component Value Date/Time   CHOL 151 06/30/2023 1311   TRIG 77 06/30/2023 1311   HDL 32 (L) 06/30/2023 1311   CHOLHDL 4.7 06/30/2023 1311   LDLCALC 104 (H) 06/30/2023 1311    Physical Exam:   VS:  There were no vitals taken for this visit. , BMI There is no height or weight on file to calculate BMI. GENERAL:  Well appearing, overweight HEENT: Pupils equal round and reactive, fundi not visualized, oral mucosa  unremarkable NECK:  No jugular venous distention, waveform within normal limits, carotid upstroke brisk and symmetric, no bruits, no thyromegaly LYMPHATICS:  No cervical adenopathy LUNGS:  Clear to auscultation bilaterally HEART:  RRR.  PMI not displaced or sustained,S1 and S2 within normal limits, no S3, no S4, no clicks, no rubs, no murmurs ABD:  Flat, positive bowel sounds normal in frequency in pitch, no bruits, no rebound, no guarding, no midline pulsatile mass, no hepatomegaly, no splenomegaly EXT:  2 plus pulses throughout, no edema, no cyanosis no clubbing SKIN:  No rashes no nodules NEURO:  Cranial nerves II through XII grossly intact, motor grossly intact throughout PSYCH:  Cognitively intact, oriented to person place and time   ASSESSMENT/PLAN:    HTN - BP not at goal <130/80. Continue Losartan-hydrochlorothiazide 50-12.5mg  BID. Valsartan-hydrochlorothiazide previously ineffective. Continue Amlodipine 5mg  daily, he is hesitant to increase due to possible side effect of edema. Increase Coreg from 3.125mg  to 6.25mg  BID. Enrolled in [employer] RPM study and BP cuff provided. Secondary workup: No snoring suggestive of OSA 05/2023 normal TSH Labs today: renin-aldosterone, metanephrines, catecholamines Plan for renal duplex to rule out RAS Recommend aiming for 150 minutes of moderate intensity activity per week and following a heart healthy diet.    Obesity - Weight loss via diet and exercise encouraged. Discussed the impact being overweight would have on cardiovascular risk. Recently started walking regimen and encouraged to continue. Zepbound previously cost prohibitive.   Screening for Secondary Hypertension:     07/06/2023   12:54 PM  Causes  Renovascular HTN Screened  Sleep Apnea N/A     - Comments no snoring nor daytime somnolence  Thyroid Disease Screened     - Comments 05/2023 normal TSH  Hyperaldosteronism Screened  Pheochromocytoma Screened  Coarctation of the Aorta N/A      - Comments BP symmetrical  Compliance Screened    Relevant Labs/Studies:    Latest Ref Rng & Units 06/30/2023    1:11 PM 11/27/2017    8:19 PM 07/24/2016    9:40 AM  Basic Labs  Sodium 134 - 144 mmol/L 141  139  139   Potassium 3.5 - 5.2 mmol/L 3.8  3.6  3.9   Creatinine 0.76 - 1.27 mg/dL 1.61  0.96  0.45        Latest Ref Rng & Units 06/30/2023    1:11 PM  Thyroid   TSH 0.450 - 4.500 uIU/mL 1.260        Latest Ref Rng & Units 07/06/2023   12:20 PM  Renin/Aldosterone   Aldosterone 0.0 - 30.0 ng/dL 40.9   Aldos/Renin Ratio 0.0 - 30.0 9.5        Latest Ref Rng & Units 07/06/2023   12:20 PM  Metanephrines/Catecholamines   Epinephrine 0 - 62 pg/mL 48   Norepinephrine 0 - 874 pg/mL 653   Dopamine 0 - 48 pg/mL <30  Metanephrines 0.0 - 88.0 pg/mL <25.0   Normetanephrines  0.0 - 210.1 pg/mL 86.7           07/06/2023   11:36 AM  Renovascular   Renal Artery [place] Completed Yes        he consents to be monitored in our remote patient monitoring program through [employer].  he will track his blood pressure twice daily and understands that these trends will help [place] to adjust his medications as needed prior to his next appointment.  ***   Disposition:    FU with MD/PharmD in ***1 month    Medication Adjustments/Labs and Tests Ordered: Current medicines are reviewed at length with the patient today.  Concerns regarding medicines are outlined above.  No orders of the defined types were placed in this encounter.  No orders of the defined types were placed in this encounter.    Signed, Alver Sorrow, NP  09/07/2023 7:58 AM    [facility]

## 2023-10-04 ENCOUNTER — Encounter: Payer: Self-pay | Admitting: Family Medicine

## 2023-10-09 ENCOUNTER — Encounter: Payer: Self-pay | Admitting: Family Medicine

## 2023-10-09 NOTE — Telephone Encounter (Signed)
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  If not he will call back.

## 2023-11-16 ENCOUNTER — Encounter (HOSPITAL_BASED_OUTPATIENT_CLINIC_OR_DEPARTMENT_OTHER): Payer: Self-pay

## 2023-12-14 ENCOUNTER — Telehealth: Payer: Self-pay | Admitting: Family Medicine

## 2023-12-14 ENCOUNTER — Other Ambulatory Visit: Payer: Self-pay

## 2023-12-14 NOTE — Telephone Encounter (Signed)
[facility] faxed refill request for the following medications:   amLODipine (NORVASC) 5 MG tablet   Please advise.  

## 2023-12-21 ENCOUNTER — Encounter (HOSPITAL_BASED_OUTPATIENT_CLINIC_OR_DEPARTMENT_OTHER): Payer: BC Managed Care – PPO | Admitting: Family

## 2023-12-25 DIAGNOSIS — Z719 Counseling, unspecified: Secondary | ICD-10-CM | POA: Diagnosis not present

## 2024-01-19 ENCOUNTER — Telehealth: Payer: Self-pay | Admitting: Family Medicine

## 2024-01-22 NOTE — Telephone Encounter (Signed)
 [facility] faxed refill request for the following medications:   carvedilol (COREG) 12.5 MG tablet     Please advise.

## 2024-01-23 MED ORDER — CARVEDILOL 12.5 MG PO TABS: 12.5000 mg | ORAL_TABLET | Freq: Two times a day (BID) | ORAL | 0 refills | Status: DC

## 2024-01-25 ENCOUNTER — Other Ambulatory Visit (HOSPITAL_BASED_OUTPATIENT_CLINIC_OR_DEPARTMENT_OTHER): Payer: Self-pay

## 2024-01-25 ENCOUNTER — Encounter (HOSPITAL_BASED_OUTPATIENT_CLINIC_OR_DEPARTMENT_OTHER): Payer: Self-pay | Admitting: Family

## 2024-01-25 ENCOUNTER — Ambulatory Visit (INDEPENDENT_AMBULATORY_CARE_PROVIDER_SITE_OTHER): Admitting: Family

## 2024-01-25 VITALS — BP 161/101 | HR 80 | Ht 76.5 in | Wt >= 6400 oz

## 2024-01-25 DIAGNOSIS — Z6841 Body Mass Index (BMI) 40.0 and over, adult: Secondary | ICD-10-CM

## 2024-01-25 DIAGNOSIS — I1A0 Resistant hypertension: Secondary | ICD-10-CM

## 2024-01-25 DIAGNOSIS — E66813 Obesity, class 3: Secondary | ICD-10-CM | POA: Diagnosis not present

## 2024-01-25 MED ORDER — CARVEDILOL 25 MG PO TABS: 25.0000 mg | ORAL_TABLET | Freq: Two times a day (BID) | ORAL | 3 refills | Status: DC

## 2024-01-25 NOTE — Progress Notes (Signed)
 Advanced Hypertension Clinic Assessment:    Date:  01/25/2024   ID:  Phillip Guerrero, DOB Nov 05, 1985, MRN [id number]  PCP:  [name], [name]  Cardiologist:  None  Nephrologist:  Referring MD: [name], [name]   CC: Hypertension  History of Present Illness:    Phillip Guerrero is a 38 y.o. male with a hx of hypertension, obesity, prediabetes, vitamin D deficiency here to follow up  in the Advanced Hypertension Clinic.   Referred by PCP 06/2023 due to uncontrolled hypertension. Losartan-hydrochlorothiazide previously switched to valsartan-hydrochlorothiazide but was found to be ineffective. Amlodipine was added at visit 06/30/23. Additionally prescribed Zepbound for weight loss.  Established with Advanced Hypertension Clinic 07/06/23. Diagnosed with hypertension a few months prior. Zepbound was $1,000  per month and cost prohibitive. Carvedilol increased to 6.25mg  BID. Renal artery duplex ordered, but no showed. No tobacco use, alcohol use socially. Renin-aldosterone, catecholamines, metanephrines normal. He was enrolled in [employer] RPM research study recommended for monthly OV x 4 months which were not completed.  Presents today for follow up. 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 He has not been checking his BP at home. He ran out of his Carvedilol yesterday and did not take an evening dose. He has not taken any medications today. He reports that he has not been working out and walking like he typically does during football season. He is eating whatever he has time to as his day is busy and he is constantly on the go. Reports no shortness of breath nor dyspnea on exertion. Reports no chest pain, pressure, or tightness. No edema, orthopnea, PND. Reports no palpitations.  Interested in GLP1 due to new insurance coverage.   Previous antihypertensives: Valsartan-HCTZ - ineffective  Past Medical History:  Diagnosis Date   Hypertension     Past Surgical  History:  Procedure Laterality Date   FRACTURE SURGERY     TESTICLE TORSION REDUCTION N/A 07/24/2016   Procedure: TESTICULAR TORSION REPAIR;  Surgeon: Malen Gauze, MD;  Location: [facility] ORS;  Service: Urology;  Laterality: N/A;    Current Medications: Current Meds  Medication Sig   amLODipine (NORVASC) 5 MG tablet Take 1 tablet daily and increase to 2 tablets after 2 weeks. (Patient taking differently: Take 5 mg by mouth daily. Take 1 tablet daily and increase to 2 tablets after 2 weeks.)   losartan-hydrochlorothiazide (HYZAAR) 50-12.5 MG tablet Take 1 tablet by mouth in the morning and at bedtime.   [DISCONTINUED] carvedilol (COREG) 12.5 MG tablet Take 1 tablet (12.5 mg total) by mouth 2 (two) times daily with a meal.     Allergies:   Patient has no known allergies.   Social History   Socioeconomic History   Marital status: Single    Spouse name: Not on file   Number of children: Not on file   Years of education: Not on file   Highest education level: Not on file  Occupational History   Not on file  Tobacco Use   Smoking status: Never   Smokeless tobacco: Never  Substance and Sexual Activity   Alcohol use: Yes    Comment: social   Drug use: No   Sexual activity: Yes    Partners: Female    Birth control/protection: Condom  Other Topics Concern   Not on file  Social History Narrative   Not on file   Social Drivers of Health   Financial Resource Strain: Low Risk  (07/06/2023)  Overall Financial Resource Strain (CARDIA)    Difficulty of Paying Living Expenses: Not hard at all  Food Insecurity: No Food Insecurity (07/06/2023)   Hunger Vital Sign    Worried About Running Out of Food in the Last Year: Never true    Ran Out of Food in the Last Year: Never true  Transportation Needs: No Transportation Needs (07/06/2023)   PRAPARE - Administrator, Civil Service (Medical): No    Lack of Transportation (Non-Medical): No  Physical Activity: Sufficiently Active  (07/06/2023)   Exercise Vital Sign    Days of Exercise per Week: 6 days    Minutes of Exercise per Session: 30 min  Stress: Not on file  Social Connections: Not on file     Family History: The patient's family history includes Diabetes in his maternal grandfather; Hypertension in his mother.  ROS:   Please see the history of present illness.     All other systems reviewed and are negative.  EKGs/Labs/Other Studies Reviewed:         Recent Labs: 06/30/2023: ALT 30; BUN 12; Creatinine, Ser 1.11; Hemoglobin 15.9; Platelets 255; Potassium 3.8; Sodium 141; TSH 1.260   Recent Lipid Panel    Component Value Date/Time   CHOL 151 06/30/2023 1311   TRIG 77 06/30/2023 1311   HDL 32 (L) 06/30/2023 1311   CHOLHDL 4.7 06/30/2023 1311   LDLCALC 104 (H) 06/30/2023 1311    Physical Exam:   VS:  BP (!) 161/101 (BP Location: Left Arm, Patient Position: Sitting, Cuff Size: Large)   Pulse 80   Ht 6' 4.5" (1.943 m)   Wt (!) 412 lb (186.9 kg)   SpO2 97%   BMI 49.50 kg/m  , BMI Body mass index is 49.5 kg/m. GENERAL:  Well appearing, overweight HEENT: Pupils equal round and reactive, fundi not visualized, oral mucosa unremarkable NECK:  No jugular venous distention, waveform within normal limits, carotid upstroke brisk and symmetric, no bruits, no thyromegaly LYMPHATICS:  No cervical adenopathy LUNGS:  Clear to auscultation bilaterally HEART:  RRR.  PMI not displaced or sustained,S1 and S2 within normal limits, no S3, no S4, no clicks, no rubs, no murmurs ABD:  Flat, positive bowel sounds normal in frequency in pitch, no bruits, no rebound, no guarding, no midline pulsatile mass, no hepatomegaly, no splenomegaly EXT:  2 plus pulses throughout, no edema, no cyanosis no clubbing SKIN:  No rashes no nodules NEURO:  Cranial nerves II through XII grossly intact, motor grossly intact throughout PSYCH:  Cognitively intact, oriented to person place and time   ASSESSMENT/PLAN:    HTN - BP not at  goal <130/80. Continue Losartan-hydrochlorothiazide 50-12.5mg  BID. Valsartan-hydrochlorothiazide previously ineffective. Continue Amlodipine 5mg  daily, he is hesitant to increase due to possible side effect of edema.  Increase Coreg from 12.5 mg to 25mg  BID.Refills ordered.  Can consider transition to amlodipine-olmesartan-hydrochlorothiazide 5-40-25mg  daily if BP not at goal, with test claim being $10/month. Alternative addition could be Spironolactone with monitoring of renal function and potassium.  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  Secondary workup: No snoring suggestive of OSA 05/2023 normal TSH 06/2023 renin-aldosterone, metanephrines, catecholamines unremarkable.  Renal duplex to rule out RAS was ordered and not performed.  Recommend aiming for 150 minutes of moderate intensity activity per week and following a heart healthy diet.    Obesity - Weight loss via diet and exercise encouraged. Discussed the impact being overweight would have on  cardiovascular risk. Zepbound prior auth initiated.   Screening for Secondary Hypertension:     07/06/2023   12:54 PM  Causes  Renovascular HTN Screened  Sleep Apnea N/A     - Comments no snoring nor daytime somnolence  Thyroid Disease Screened     - Comments 05/2023 normal TSH  Hyperaldosteronism Screened  Pheochromocytoma Screened  Coarctation of the Aorta N/A     - Comments BP symmetrical  Compliance Screened    Relevant Labs/Studies:    Latest Ref Rng & Units 06/30/2023    1:11 PM 11/27/2017    8:19 PM 07/24/2016    9:40 AM  Basic Labs  Sodium 134 - 144 mmol/L 141  139  139   Potassium 3.5 - 5.2 mmol/L 3.8  3.6  3.9   Creatinine 0.76 - 1.27 mg/dL 1.61  0.96  0.45        Latest Ref Rng & Units 06/30/2023    1:11 PM  Thyroid   TSH 0.450 - 4.500 uIU/mL 1.260        Latest Ref Rng & Units 07/06/2023   12:20 PM  Renin/Aldosterone   Aldosterone 0.0 - 30.0 ng/dL 40.9   Aldos/Renin Ratio 0.0 -  30.0 9.5        Latest Ref Rng & Units 07/06/2023   12:20 PM  Metanephrines/Catecholamines   Epinephrine 0 - 62 pg/mL 48   Norepinephrine 0 - 874 pg/mL 653   Dopamine 0 - 48 pg/mL <30   Metanephrines 0.0 - 88.0 pg/mL <25.0   Normetanephrines  0.0 - 210.1 pg/mL 86.7           07/06/2023   11:36 AM  Renovascular   Renal Artery [place] Completed Yes         Disposition:    FU with MD/PharmD/APP in 6-8 weeks.    Medication Adjustments/Labs and Tests Ordered: Current medicines are reviewed at length with the patient today.  Concerns regarding medicines are outlined above.  Orders Placed This Encounter  Procedures   EKG 12-Lead   Meds ordered this encounter  Medications   carvedilol (COREG) 25 MG tablet    Sig: Take 1 tablet (25 mg total) by mouth 2 (two) times daily with a meal.    Dispense:  60 tablet    Refill:  3    Please tell patient appointment is needed for further refills     Signed, Alver Sorrow, NP  01/25/2024 4:37 PM     [facility]

## 2024-01-25 NOTE — Patient Instructions (Signed)
 Medication Instructions:  Your physician has recommended you make the following change in your medication:   Increase Carvedilol 25mg  twice daily    Follow-Up: Please follow up in 6-8 weeks in [facility] with Dr. Duke Salvia, Gillian Shields, NP or Phillips Hay PharmD

## 2024-01-30 ENCOUNTER — Ambulatory Visit: Payer: PRIVATE HEALTH INSURANCE | Admitting: Family Medicine

## 2024-02-08 ENCOUNTER — Encounter: Payer: Self-pay | Admitting: Family Medicine

## 2024-02-08 ENCOUNTER — Ambulatory Visit: Payer: Self-pay | Admitting: Family Medicine

## 2024-02-08 VITALS — BP 155/105 | HR 88 | Ht 76.5 in | Wt >= 6400 oz

## 2024-02-08 DIAGNOSIS — E559 Vitamin D deficiency, unspecified: Secondary | ICD-10-CM

## 2024-02-08 DIAGNOSIS — I10 Essential (primary) hypertension: Secondary | ICD-10-CM

## 2024-02-08 DIAGNOSIS — R7303 Prediabetes: Secondary | ICD-10-CM | POA: Diagnosis not present

## 2024-02-08 DIAGNOSIS — E7800 Pure hypercholesterolemia, unspecified: Secondary | ICD-10-CM

## 2024-02-08 MED ORDER — VITAMIN D (ERGOCALCIFEROL) 1.25 MG (50000 UNIT) PO CAPS
50000.0000 [IU] | ORAL_CAPSULE | ORAL | 0 refills | Status: DC
Start: 2024-02-08 — End: 2024-06-07

## 2024-02-08 MED ORDER — TIRZEPATIDE-WEIGHT MANAGEMENT 2.5 MG/0.5ML ~~LOC~~ SOAJ: 2.5000 mg | SUBCUTANEOUS | 1 refills | Status: DC

## 2024-02-08 MED ORDER — ZEPBOUND 5 MG/0.5ML ~~LOC~~ SOAJ
5.0000 mg | SUBCUTANEOUS | 2 refills | Status: AC
Start: 2024-03-11 — End: ?

## 2024-02-08 NOTE — Assessment & Plan Note (Signed)
 Mild hyperlipidemia with an LDL cholesterol level of 104 mg/dL. The goal is to reduce LDL to below 100 mg/dL. Dietary habits may contribute to elevated cholesterol levels. Discussed the importance of dietary modifications to achieve lipid goals. - Encourage dietary modifications to reduce cholesterol intake. - Consider re-evaluation of lipid levels after dietary changes.

## 2024-02-08 NOTE — Assessment & Plan Note (Signed)
 Pre-diabetes with a recent A1c of 5.7%. Advised that weight loss and lifestyle changes can help revert to normal glucose levels. Emphasized the importance of monitoring and preventing progression to diabetes. - Monitor A1c levels regularly. - Encourage weight loss and lifestyle modifications to prevent progression to diabetes.

## 2024-02-08 NOTE — Assessment & Plan Note (Signed)
 Severe vitamin D deficiency with a level of 15 ng/mL. Despite significant time outdoors, low vitamin D level possibly related to obesity. Explained the importance of vitamin D for bone health and energy levels. Recommended high-dose vitamin D supplementation to address deficiency. - Prescribe vitamin D 50,000 IU once weekly. - Educate on the importance of vitamin D for bone health and energy levels.

## 2024-02-08 NOTE — Assessment & Plan Note (Signed)
 Chronic hypertension with a current reading of 170/102 mmHg. Family history of hypertension. Previous readings have been as high as 200 mmHg. Current medications include amlodipine 5 mg once daily, carvedilol 25 mg twice daily, and a combination pill of losartan 50 mg with hydrochlorothiazide 12.5 mg. Resistant hypertension noted. Previous workup including retinol, aldosterone, catecholamines, and metanephrine levels were normal. No history of sleep apnea. Reports inconsistent medication adherence and dietary habits that may contribute to elevated blood pressure. Discussed the importance of achieving a target blood pressure of 130/80 mmHg to reduce cardiovascular risk. Renal ultrasound ordered to evaluate for renal artery stenosis or adrenal abnormalities. - Continue current antihypertensive regimen. - Order renal ultrasound to evaluate for renal artery stenosis or adrenal abnormalities. - Encourage adherence to medication regimen and dietary modifications to reduce sodium intake. - Schedule follow-up in 8 weeks to assess blood pressure control and medication efficacy.

## 2024-02-08 NOTE — Progress Notes (Addendum)
 Established patient visit   Patient: Phillip Guerrero   DOB: September 16, 1986   38 y.o. Male  MRN: [id number] Visit Date: 02/08/2024  Today's healthcare provider: Mimi Alt, MD   Chief Complaint  Patient presents with   Establish Care    Transfering from [facility] no acute concerns   Hypertension    No concerns, high sodium diet but is working on moderation   Medication Refill    Wants to dicuss previous zepbound  prescription now that with new insurance   Subjective     HPI     Establish Care    Additional comments: Transfering from [facility] no acute concerns        Hypertension    Additional comments: No concerns, high sodium diet but is working on moderation        Medication Refill    Additional comments: Wants to dicuss previous zepbound  prescription now that with new insurance      Last edited by Gennaro Khat on 02/08/2024  2:57 PM.       Discussed the use of AI scribe software for clinical note transcription with the patient, who gave verbal consent to proceed.  History of Present Illness Phillip Guerrero is a 38 year old male with obesity and hypertension who presents to transfer care.  He has a history of hypertension with current blood pressure readings around 170/102 mmHg, initially starting at 200 mmHg, and usually around 160 mmHg. No symptoms are present when his blood pressure is elevated, and he states 'I'm just, I'm good.' He recalls having headaches in the past but does not currently experience them. He is currently taking amlodipine  5 mg once daily, carvedilol  25 mg twice daily, and a combination pill of losartan  50 mg with hydrochlorothiazide  12.5 mg once daily. He has not started the increased dose of carvedilol  as prescribed. He has a history of resistant hypertension and has undergone tests for retinol, aldosterone, catecholamines, and metanephrine, all of which were normal.  He has a family history of hypertension affecting his mother,  aunt, grandmother, and possibly his father. He is a [employer] and mentions that his [employer] ([employer]) card was temporarily limited due to high blood pressure, prompting his initial visit to the clinic.  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 He mentions a past experience where a medical report labeled him as 'morbidly obese,' which he found alarming. He wants to lose weight and has considered meal prepping but has not yet implemented it.  He denies having sleep apnea and has not undergone a sleep study. He reports sleeping only 3-4 hours per night due to his work schedule and other commitments, such as coaching football and spending time with his children. He sometimes naps during the day but struggles to sleep when it's daylight.  His vitamin D  level was noted to be 15 ng/mL, which he was unaware of. He spends a lot of time outside but acknowledges that he may not absorb vitamin D  as effectively due to obesity. He has not experienced any symptoms related to low vitamin D  levels.  His last A1c was 5.7%, indicating prediabetes, and his LDL cholesterol was slightly elevated at 104 mg/dL. He attributes a recent high blood pressure reading to having eaten a high-sodium meal and missing his medication that morning.     Past Medical History:  Diagnosis Date  Hypertension     Medications: Outpatient Medications Prior to Visit  Medication Sig   amLODipine  (NORVASC ) 5 MG tablet Take 1 tablet daily and increase to 2 tablets after 2 weeks. (Patient taking differently: Take 5 mg by mouth daily. Take 1 tablet daily and increase to 2 tablets after 2 weeks.)   carvedilol  (COREG ) 25 MG tablet Take 1 tablet (25 mg total) by mouth 2 (two) times daily with a meal.   losartan -hydrochlorothiazide  (HYZAAR) 50-12.5 MG tablet Take 1 tablet by mouth in the morning and at bedtime.    No facility-administered medications prior to visit.    Review of Systems  Last CBC Lab Results  Component Value Date   WBC 8.0 06/30/2023   HGB 15.9 06/30/2023   HCT 46.9 06/30/2023   MCV 98 (H) 06/30/2023   MCH 33.2 (H) 06/30/2023   RDW 11.9 06/30/2023   PLT 255 06/30/2023   Last metabolic panel Lab Results  Component Value Date   GLUCOSE 113 (H) 06/30/2023   NA 141 06/30/2023   K 3.8 06/30/2023   CL 101 06/30/2023   CO2 25 06/30/2023   BUN 12 06/30/2023   CREATININE 1.11 06/30/2023   EGFR 88 06/30/2023   CALCIUM 9.7 06/30/2023   PROT 7.5 06/30/2023   ALBUMIN 4.4 06/30/2023   LABGLOB 3.1 06/30/2023   BILITOT 1.8 (H) 06/30/2023   ALKPHOS 61 06/30/2023   AST 42 (H) 06/30/2023   ALT 30 06/30/2023   ANIONGAP 10 11/27/2017   Last lipids Lab Results  Component Value Date   CHOL 151 06/30/2023   HDL 32 (L) 06/30/2023   LDLCALC 104 (H) 06/30/2023   TRIG 77 06/30/2023   CHOLHDL 4.7 06/30/2023   Last hemoglobin A1c Lab Results  Component Value Date   HGBA1C 5.7 (H) 06/30/2023   Last thyroid functions Lab Results  Component Value Date   TSH 1.260 06/30/2023   Last vitamin D  Lab Results  Component Value Date   VD25OH 15.6 (L) 06/30/2023   Last vitamin B12 and Folate No results found for: "VITAMINB12", "FOLATE"      Objective    BP (!) 155/105 (BP Location: Left Arm, Patient Position: Sitting, Cuff Size: Large)   Pulse 88   Ht 6' 4.5" (1.943 m)   Wt (!) 411 lb (186.4 kg)   SpO2 100%   BMI 49.38 kg/m  BP Readings from Last 3 Encounters:  02/08/24 (!) 155/105  01/25/24 (!) 161/101  08/01/23 (!) 159/105   Wt Readings from Last 3 Encounters:  02/08/24 (!) 411 lb (186.4 kg)  01/25/24 (!) 412 lb (186.9 kg)  08/01/23 (!) 406 lb (184.2 kg)        Physical Exam Vitals reviewed.  Constitutional:      General: He is not in acute distress.    Appearance: Normal appearance. He is not ill-appearing, toxic-appearing or diaphoretic.  Eyes:      Conjunctiva/sclera: Conjunctivae normal.  Cardiovascular:     Rate and Rhythm: Normal rate and regular rhythm.     Pulses: Normal pulses.     Heart sounds: Normal heart sounds. No murmur heard.    No friction rub. No gallop.  Pulmonary:     Effort: Pulmonary effort is normal. No respiratory distress.     Breath sounds: Normal breath sounds. No stridor. No wheezing, rhonchi or rales.  Abdominal:     General: Bowel sounds are normal. There is no distension.     Palpations: Abdomen is soft.     Tenderness: There  is no abdominal tenderness.  Musculoskeletal:     Right lower leg: No edema.     Left lower leg: No edema.  Skin:    Findings: No erythema or rash.  Neurological:     Mental Status: He is alert and oriented to person, place, and time.  Psychiatric:        Mood and Affect: Mood and affect normal.        Speech: Speech normal.        Behavior: Behavior normal. Behavior is cooperative.      No results found for any visits on 02/08/24.  Assessment & Plan     Problem List Items Addressed This Visit       Cardiovascular and Mediastinum   Severe uncontrolled hypertension - Primary   Chronic hypertension with a current reading of 170/102 mmHg. Family history of hypertension. Previous readings have been as high as 200 mmHg. Current medications include amlodipine  5 mg once daily, carvedilol  25 mg twice daily, and a combination pill of losartan  50 mg with hydrochlorothiazide  12.5 mg. Resistant hypertension noted. Previous workup including retinol, aldosterone, catecholamines, and metanephrine levels were normal. No history of sleep apnea. Reports inconsistent medication adherence and dietary habits that may contribute to elevated blood pressure. Discussed the importance of achieving a target blood pressure of 130/80 mmHg to reduce cardiovascular risk. Renal ultrasound ordered to evaluate for renal artery stenosis or adrenal abnormalities. - Continue current antihypertensive regimen. -  Order renal ultrasound to evaluate for renal artery stenosis or adrenal abnormalities. - Encourage adherence to medication regimen and dietary modifications to reduce sodium intake. - Schedule follow-up in 8 weeks to assess blood pressure control and medication efficacy.      Relevant Orders   US  RENAL ARTERY DUPLEX COMPLETE     Other   Prediabetes   Pre-diabetes with a recent A1c of 5.7%. Advised that weight loss and lifestyle changes can help revert to normal glucose levels. Emphasized the importance of monitoring and preventing progression to diabetes. - Monitor A1c levels regularly. - Encourage weight loss and lifestyle modifications to prevent progression to diabetes.      Morbid obesity (HCC)   Obesity with a BMI of 49.38. Sedentary lifestyle and inconsistent physical activity. Candidate for medication-assisted weight loss to aid in reducing cardiovascular risk and improving blood pressure control. Discussed starting Zioptan at 2.5 mg once weekly, with plans to increase to 5 mg after one month, pending insurance approval. Explained that weight loss can significantly aid in blood pressure reduction and overall health improvement. - Prescribe Zepbound  starting at 2.5 mg once weekly, with plans to increase to 5 mg after one month, pending insurance approval. - Encourage regular physical activity and dietary modifications to support weight loss. - Discuss potential side effects of Zepbound , including nausea and stomach ache.      Relevant Medications   tirzepatide  (ZEPBOUND ) 2.5 MG/0.5ML Pen   tirzepatide  (ZEPBOUND ) 5 MG/0.5ML Pen (Start on 03/11/2024)   Elevated LDL cholesterol level   Mild hyperlipidemia with an LDL cholesterol level of 104 mg/dL. The goal is to reduce LDL to below 100 mg/dL. Dietary habits may contribute to elevated cholesterol levels. Discussed the importance of dietary modifications to achieve lipid goals. - Encourage dietary modifications to reduce cholesterol  intake. - Consider re-evaluation of lipid levels after dietary changes.      Avitaminosis D   Severe vitamin D  deficiency with a level of 15 ng/mL. Despite significant time outdoors, low vitamin D  level possibly related to  obesity. Explained the importance of vitamin D  for bone health and energy levels. Recommended high-dose vitamin D  supplementation to address deficiency. - Prescribe vitamin D  50,000 IU once weekly. - Educate on the importance of vitamin D  for bone health and energy levels.      Relevant Medications   Vitamin D , Ergocalciferol , (DRISDOL ) 1.25 MG (50000 UNIT) CAPS capsule   Assessment & Plan        Return in about 2 months (around 04/09/2024) for Weight MGMT.         Mimi Alt, MD  [facility] [phone] (phone) [phone] (fax)  [facility]

## 2024-02-08 NOTE — Assessment & Plan Note (Addendum)
 Obesity with a BMI of 49.38. Sedentary lifestyle and inconsistent physical activity. Candidate for medication-assisted weight loss to aid in reducing cardiovascular risk and improving blood pressure control. Discussed starting Zioptan at 2.5 mg once weekly, with plans to increase to 5 mg after one month, pending insurance approval. Explained that weight loss can significantly aid in blood pressure reduction and overall health improvement. - Prescribe Zepbound  starting at 2.5 mg once weekly, with plans to increase to 5 mg after one month, pending insurance approval. - Encourage regular physical activity and dietary modifications to support weight loss. - Discuss potential side effects of Zepbound , including nausea and stomach ache.

## 2024-02-14 ENCOUNTER — Other Ambulatory Visit (HOSPITAL_COMMUNITY): Payer: Self-pay

## 2024-02-15 ENCOUNTER — Encounter (HOSPITAL_BASED_OUTPATIENT_CLINIC_OR_DEPARTMENT_OTHER): Payer: BC Managed Care – PPO | Admitting: Family

## 2024-02-15 ENCOUNTER — Ambulatory Visit: Admission: RE | Admit: 2024-02-15 | Source: Ambulatory Visit

## 2024-02-19 ENCOUNTER — Encounter: Payer: Self-pay | Admitting: Family Medicine

## 2024-02-22 ENCOUNTER — Ambulatory Visit

## 2024-02-23 ENCOUNTER — Telehealth: Payer: Self-pay

## 2024-02-23 ENCOUNTER — Other Ambulatory Visit (HOSPITAL_COMMUNITY): Payer: Self-pay

## 2024-02-23 NOTE — Telephone Encounter (Signed)
 Please see the denial message below

## 2024-02-23 NOTE — Telephone Encounter (Signed)
 Pharmacy Patient Advocate Encounter   Received notification from RX Request Messages that prior authorization for Zepbound  2.5 mg/0.5 ml auto injector is required/requested.   Insurance verification completed.   The patient is insured through [employer] [place]   .   Per test claim:

## 2024-02-26 NOTE — Telephone Encounter (Signed)
 Pt aware of denial. Plan to discuss additional options at next follow up. See MyChart message for recommendation to review direct pharmacy option

## 2024-02-28 ENCOUNTER — Ambulatory Visit
Admission: RE | Admit: 2024-02-28 | Discharge: 2024-02-28 | Disposition: A | Discharge status: Home / Self Care | Source: Ambulatory Visit | Attending: Family Medicine | Admitting: Family Medicine

## 2024-02-28 DIAGNOSIS — I10 Essential (primary) hypertension: Secondary | ICD-10-CM | POA: Insufficient documentation

## 2024-03-11 ENCOUNTER — Ambulatory Visit: Payer: Self-pay | Admitting: Family Medicine

## 2024-03-25 ENCOUNTER — Encounter (HOSPITAL_BASED_OUTPATIENT_CLINIC_OR_DEPARTMENT_OTHER): Payer: Self-pay | Admitting: Family

## 2024-03-28 ENCOUNTER — Encounter (HOSPITAL_BASED_OUTPATIENT_CLINIC_OR_DEPARTMENT_OTHER): Admitting: Family

## 2024-04-09 ENCOUNTER — Encounter: Payer: Self-pay | Admitting: Family Medicine

## 2024-04-09 ENCOUNTER — Ambulatory Visit (INDEPENDENT_AMBULATORY_CARE_PROVIDER_SITE_OTHER): Admitting: Family Medicine

## 2024-04-09 DIAGNOSIS — I10 Essential (primary) hypertension: Secondary | ICD-10-CM

## 2024-04-09 DIAGNOSIS — Z6841 Body Mass Index (BMI) 40.0 and over, adult: Secondary | ICD-10-CM

## 2024-04-09 MED ORDER — NALTREXONE HCL 50 MG PO TABS: 50.0000 mg | ORAL_TABLET | Freq: Every day | ORAL | 3 refills | Status: DC

## 2024-04-09 MED ORDER — BUPROPION HCL ER (XL) 150 MG PO TB24: 150.0000 mg | ORAL_TABLET | Freq: Every day | ORAL | 3 refills | Status: DC

## 2024-04-09 MED ORDER — AMLODIPINE BESYLATE 5 MG PO TABS: 5.0000 mg | ORAL_TABLET | Freq: Every day | ORAL | Status: DC

## 2024-04-09 NOTE — Progress Notes (Signed)
 Established patient visit   Patient: Phillip Guerrero   DOB: Mar 14, 1986   38 y.o. Male  MRN: [id number] Visit Date: 04/09/2024  Today's healthcare provider: Mimi Alt, MD   Chief Complaint  Patient presents with   Weight Loss    No concerns   Subjective     HPI     Weight Loss    Additional comments: No concerns      Last edited by Bart Lieu, CMA on 04/09/2024  3:51 PM.       Discussed the use of AI scribe software for clinical note transcription with the patient, who gave verbal consent to proceed.  History of Present Illness Phillip Guerrero is a 38 year old male with obesity who presents for a follow-up on weight management.  He weighs 417 pounds with a BMI of 50. He has been exploring weight loss medications, but his current insurance, [employer], does not cover weight loss shots like Zepbound . He is considering switching to [employer], which may offer coverage under certain conditions.  He has a history of hypertension and is taking medication for it. His current medications include losartan  hydrochlorothiazide  at a doubled dose, carvedilol  25 mg twice a day, and amlodipine  5 mg once a day. He takes his blood pressure medications as prescribed.  He is concerned about prediabetes, as he is 'borderline' according to previous tests. He is cautious about medications like metformin due to concerns about side effects, particularly those affecting 'manhood', although his kidney function is normal.  He reports poor sleep patterns, getting about four hours of sleep per night, and has been told he snores, though not excessively. No choking or gasping during sleep, but he does wake up to urinate frequently. He attributes some of his weight issues to eating before bed and not sleeping well.  He is considering weight loss medications and has discussed options like Wellbutrin and naltrexone for appetite suppression. He is also planning to  start a new insurance policy with [place] in July, which may influence his access to these medications.     Past Medical History:  Diagnosis Date   Hypertension     Medications: Outpatient Medications Prior to Visit  Medication Sig   [DISCONTINUED] tirzepatide  (ZEPBOUND ) 2.5 MG/0.5ML Pen Inject 2.5 mg into the skin once a week.   [DISCONTINUED] tirzepatide  (ZEPBOUND ) 5 MG/0.5ML Pen Inject 5 mg into the skin once a week.   carvedilol  (COREG ) 25 MG tablet Take 1 tablet (25 mg total) by mouth 2 (two) times daily with a meal.   losartan -hydrochlorothiazide  (HYZAAR) 50-12.5 MG tablet Take 1 tablet by mouth in the morning and at bedtime.   Vitamin D , Ergocalciferol , (DRISDOL ) 1.25 MG (50000 UNIT) CAPS capsule Take 1 capsule (50,000 Units total) by mouth every 7 (seven) days.   [DISCONTINUED] amLODipine  (NORVASC ) 5 MG tablet Take 1 tablet daily and increase to 2 tablets after 2 weeks. (Patient taking differently: Take 5 mg by mouth daily. Take 1 tablet daily and increase to 2 tablets after 2 weeks.)   No facility-administered medications prior to visit.    Review of Systems  Last metabolic panel Lab Results  Component Value Date   GLUCOSE 113 (H) 06/30/2023   NA 141 06/30/2023   K 3.8 06/30/2023   CL 101 06/30/2023   CO2 25 06/30/2023   BUN 12 06/30/2023   CREATININE 1.11 06/30/2023   EGFR 88 06/30/2023   CALCIUM 9.7 06/30/2023   PROT 7.5  06/30/2023   ALBUMIN 4.4 06/30/2023   LABGLOB 3.1 06/30/2023   BILITOT 1.8 (H) 06/30/2023   ALKPHOS 61 06/30/2023   AST 42 (H) 06/30/2023   ALT 30 06/30/2023   ANIONGAP 10 11/27/2017   Last hemoglobin A1c Lab Results  Component Value Date   HGBA1C 5.7 (H) 06/30/2023   Last thyroid functions Lab Results  Component Value Date   TSH 1.260 06/30/2023        Objective    BP 136/87   Pulse 86   Ht 6' 4 (1.93 m)   Wt (!) 417 lb (189.1 kg)   SpO2 97%   BMI 50.76 kg/m  BP Readings from Last 3 Encounters:  04/09/24 136/87   02/08/24 (!) 155/105  01/25/24 (!) 161/101   Wt Readings from Last 3 Encounters:  04/09/24 (!) 417 lb (189.1 kg)  02/08/24 (!) 411 lb (186.4 kg)  01/25/24 (!) 412 lb (186.9 kg)        Physical Exam  General: Alert, no acute distress Cardio: Normal S1 and S2, RRR, no r/m/g Pulm: CTAB, normal work of breathing    No results found for any visits on 04/09/24.  Assessment & Plan     Problem List Items Addressed This Visit       Cardiovascular and Mediastinum   Primary hypertension   Relevant Medications   amLODipine  (NORVASC ) 5 MG tablet     Other   Morbid obesity (HCC) - Primary   Relevant Medications   buPROPion (WELLBUTRIN XL) 150 MG 24 hr tablet   naltrexone (DEPADE) 50 MG tablet   Other Visit Diagnoses       BMI 50.0-59.9, adult (HCC)           Assessment & Plan Obesity BMI of 50 indicates severe obesity. Insurance coverage for weight loss medications like Zepbound  was denied by [employer]. He is considering switching to [employer], which may offer coverage. He meets criteria for weight loss medications due to BMI and hypertension. Discussed options including Wegovy, Saxenda, and Zepbound . He prefers to avoid phentermine and metformin due to potential side effects. Zepbound  may reduce body weight by 15-20%, with common side effects of nausea, constipation, and diarrhea. Phentermine is not recommended due to potential increase in blood pressure and heart rate. Metformin is generally safe with normal kidney function but is not preferred by him. - No Zepbound  samples were available in clinic today - Prescribe Wellbutrin 150 mg daily and naltrexone 50 mg daily for appetite suppression. - Encourage exercise, aiming for 7,000 to 10,000 steps a day and weightlifting. - Advise on dietary modifications, including increased protein intake, reduced red meat, and limited bread and sugar. - Monitor for side effects such as nausea, constipation, and  diarrhea.  Hypertension Blood pressure is well-controlled with losartan , carvedilol , and amlodipine . The goal is to further improve control through weight loss. - Continue losartan  50mg  daily, carvedilol  3.125mg  BID , and amlodipine  5mg  daily - Monitor blood pressure regularly. - Reassess blood pressure control as weight decreases.  Prediabetes He is in the prediabetes range. Discussed metformin for weight loss and prediabetes management, but he declined due to concerns about side effects. Metformin is generally safe with normal kidney function but has a lesser effect compared to other medications. - Monitor blood glucose levels regularly. - Encourage lifestyle modifications including diet and exercise to prevent progression to diabetes.  General Health Maintenance Discussed the importance of lifestyle modifications for overall health improvement, including exercise and dietary changes. -  Encourage regular physical activity and dietary modifications as part of weight management and overall health improvement.  Follow-up He plans to switch to [place] insurance by July 1st, which may offer better coverage for weight loss medications. - Schedule follow-up appointment in July to reassess weight management plan and insurance coverage. - Instruct to inform the clinic once new insurance is active.     Return in about 1 month (around 05/09/2024) for Weight MGMT.         Mimi Alt, MD  [facility] [phone] (phone) [phone] (fax)  [facility]

## 2024-04-24 ENCOUNTER — Encounter (HOSPITAL_BASED_OUTPATIENT_CLINIC_OR_DEPARTMENT_OTHER): Payer: Self-pay | Admitting: Family

## 2024-04-24 ENCOUNTER — Ambulatory Visit (HOSPITAL_BASED_OUTPATIENT_CLINIC_OR_DEPARTMENT_OTHER): Admitting: Family

## 2024-04-24 DIAGNOSIS — I10 Essential (primary) hypertension: Secondary | ICD-10-CM | POA: Diagnosis not present

## 2024-04-24 MED ORDER — AMLODIPINE BESYLATE 5 MG PO TABS: 5.0000 mg | ORAL_TABLET | Freq: Every day | ORAL | 3 refills | Status: DC

## 2024-04-24 MED ORDER — CARVEDILOL 25 MG PO TABS: 25.0000 mg | ORAL_TABLET | Freq: Two times a day (BID) | ORAL | 3 refills | Status: DC

## 2024-04-24 NOTE — Progress Notes (Signed)
 [facility] Assessment:    Date:  04/24/2024   ID:  Phillip Guerrero, DOB 1986/06/07, MRN [id number]  PCP:  Sharma Coyer, MD  Cardiologist:  None  Nephrologist:  Referring MD: Emilio Kelly DASEN, [name]   CC: Hypertension  History of Present Illness:    Phillip Guerrero is a 38 y.o. male with a hx of hypertension, obesity, prediabetes, vitamin D  deficiency here to follow up  in the [facility].   Referred by PCP 06/2023 due to uncontrolled hypertension. Losartan -hydrochlorothiazide  previously switched to valsartan -hydrochlorothiazide  but was found to be ineffective. Amlodipine  was added at visit 06/30/23. Additionally prescribed Zepbound  for weight loss.  Established with [facility] 07/06/23. Diagnosed with hypertension a few months prior. Zepbound  found to be cost prohibitive. Carvedilol  increased to 6.25mg  BID. Renal artery duplex ordered, but no showed. No tobacco use, alcohol use socially. Renin-aldosterone, catecholamines, metanephrines normal. He was enrolled in [employer] research study recommended for monthly OV x 4 months which were not completed. Re-established care 01/25/24 at which time Carvedilol  increased to 25mg  BID.   Presents today for follow up independently. Has not been monitoring BP at home. He consumed an energy drink today, which may have elevated his blood pressure. He experiences a sensation of something 'stretching' in his throat/upper chest when taking his medication. No chest pain, lightheadedness, or dizziness.  He notes a change in urination pattern since starting blood pressure medication, urinating less frequently at night and during the day despite adequate water intake.  He is a Futures trader and drives flatbed truck. No formal exercise routine. He consumes energy drinks, specifically Red Bull and V8. Often eating convenience foods higher in sodium.   Previous  antihypertensives: Valsartan -HCTZ - ineffective  Past Medical History:  Diagnosis Date   Hypertension     Past Surgical History:  Procedure Laterality Date   FRACTURE SURGERY     TESTICLE TORSION REDUCTION N/A 07/24/2016   Procedure: TESTICULAR TORSION REPAIR;  Surgeon: Belvie LITTIE Clara, MD;  Location: [facility] ORS;  Service: Urology;  Laterality: N/A;    Current Medications: Current Meds  Medication Sig   amLODipine  (NORVASC ) 5 MG tablet Take 1 tablet (5 mg total) by mouth daily.   buPROPion  (WELLBUTRIN  XL) 150 MG 24 hr tablet Take 1 tablet (150 mg total) by mouth daily.   carvedilol  (COREG ) 25 MG tablet Take 1 tablet (25 mg total) by mouth 2 (two) times daily with a meal.   losartan -hydrochlorothiazide  (HYZAAR) 50-12.5 MG tablet Take 1 tablet by mouth in the morning and at bedtime.   naltrexone  (DEPADE) 50 MG tablet Take 1 tablet (50 mg total) by mouth daily.   Vitamin D , Ergocalciferol , (DRISDOL ) 1.25 MG (50000 UNIT) CAPS capsule Take 1 capsule (50,000 Units total) by mouth every 7 (seven) days.     Allergies:   Patient has no known allergies.   Social History   Socioeconomic History   Marital status: Single    Spouse name: Not on file   Number of children: Not on file   Years of education: Not on file   Highest education level: Not on file  Occupational History   Not on file  Tobacco Use   Smoking status: Never   Smokeless tobacco: Never  Substance and Sexual Activity   Alcohol use: Yes    Comment: social   Drug use: No   Sexual activity: Yes    Partners: Female    Birth control/protection: Condom  Other Topics Concern  Not on file  Social History Narrative   Not on file   Social Drivers of Health   Financial Resource Strain: Low Risk  (04/09/2024)   Overall Financial Resource Strain (CARDIA)    Difficulty of Paying Living Expenses: Not hard at all  Food Insecurity: No Food Insecurity (04/09/2024)   Hunger Vital Sign    Worried About Running Out of Food in  the Last Year: Never true    Ran Out of Food in the Last Year: Never true  Transportation Needs: No Transportation Needs (04/09/2024)   PRAPARE - Administrator, Civil Service (Medical): No    Lack of Transportation (Non-Medical): No  Physical Activity: Sufficiently Active (07/06/2023)   Exercise Vital Sign    Days of Exercise per Week: 6 days    Minutes of Exercise per Session: 30 min  Stress: No Stress Concern Present (04/09/2024)   [employer] of Occupational Health - Occupational Stress Questionnaire    Feeling of Stress: Not at all  Social Connections: Not on file     Family History: The patient's family history includes Diabetes in his maternal grandfather; Hypertension in his mother.  ROS:   Please see the history of present illness.     All other systems reviewed and are negative.  EKGs/Labs/Other Studies Reviewed:         Recent Labs: 06/30/2023: ALT 30; BUN 12; Creatinine, Ser 1.11; Hemoglobin 15.9; Platelets 255; Potassium 3.8; Sodium 141; TSH 1.260   Recent Lipid Panel    Component Value Date/Time   CHOL 151 06/30/2023 1311   TRIG 77 06/30/2023 1311   HDL 32 (L) 06/30/2023 1311   CHOLHDL 4.7 06/30/2023 1311   LDLCALC 104 (H) 06/30/2023 1311    Physical Exam:   VS:  BP (!) 152/82   Pulse 68   Ht 6' 4 (1.93 m)   Wt (!) 412 lb (186.9 kg)   SpO2 94%   BMI 50.15 kg/m  , BMI Body mass index is 50.15 kg/m.  Vitals:   04/24/24 1547 04/24/24 1617  BP: (!) 160/120 (!) 152/82  Pulse: 68   Height: 6' 4 (1.93 m)   Weight: (!) 412 lb (186.9 kg)   SpO2: 94%   BMI (Calculated): 50.17     GENERAL:  Well appearing, overweight HEENT: Pupils equal round and reactive, fundi not visualized, oral mucosa unremarkable NECK:  No jugular venous distention, waveform within normal limits, carotid upstroke brisk and symmetric, no bruits, no thyromegaly LYMPHATICS:  No cervical adenopathy LUNGS:  Clear to auscultation bilaterally HEART:  RRR.  PMI not  displaced or sustained,S1 and S2 within normal limits, no S3, no S4, no clicks, no rubs, no murmurs ABD:  Flat, positive bowel sounds normal in frequency in pitch, no bruits, no rebound, no guarding, no midline pulsatile mass, no hepatomegaly, no splenomegaly EXT:  2 plus pulses throughout, no edema, no cyanosis no clubbing SKIN:  No rashes no nodules NEURO:  Cranial nerves II through XII grossly intact, motor grossly intact throughout PSYCH:  Cognitively intact, oriented to person place and time   ASSESSMENT/PLAN:    HTN - BP not at goal <130/80. Declines additional medication changes today preferring lifestyle changes. Recommend aiming for 150 minutes of moderate intensity activity per week and following a heart healthy diet.  Continue Losartan -hydrochlorothiazide  50-12.5mg  BID. Valsartan -hydrochlorothiazide  previously ineffective. Continue Amlodipine  5mg  daily, he is hesitant to increase due to possible side effect of edema. Continue Coreg  25mg  BID.   Discussed to monitor BP  at home at least 2 hours after medications and sitting for 5-10 minutes.  Secondary workup: No snoring suggestive of OSA 05/2023 normal TSH 06/2023 renin-aldosterone, metanephrines, catecholamines unremarkable.  Renal duplex to rule out RAS was ordered and not performed.  Recommend aiming for 150 minutes of moderate intensity activity per week and following a heart healthy diet.    Obesity - Weight loss via diet and exercise encouraged. Discussed the impact being overweight would have on cardiovascular risk. Zepbound  previously cost prohibitive. Establishing with new insurance and plans to re-attempt Zepbound  PA with PCP.   Screening for Secondary Hypertension:     07/06/2023   12:54 PM  Causes  Renovascular HTN Screened  Sleep Apnea N/A     - Comments no snoring nor daytime somnolence  Thyroid Disease Screened     - Comments 05/2023 normal TSH  Hyperaldosteronism Screened  Pheochromocytoma Screened  Coarctation  of the Aorta N/A     - Comments BP symmetrical  Compliance Screened    Relevant Labs/Studies:    Latest Ref Rng & Units 06/30/2023    1:11 PM 11/27/2017    8:19 PM 07/24/2016    9:40 AM  Basic Labs  Sodium 134 - 144 mmol/L 141  139  139   Potassium 3.5 - 5.2 mmol/L 3.8  3.6  3.9   Creatinine 0.76 - 1.27 mg/dL 8.88  9.00  9.09        Latest Ref Rng & Units 06/30/2023    1:11 PM  Thyroid   TSH 0.450 - 4.500 uIU/mL 1.260        Latest Ref Rng & Units 07/06/2023   12:20 PM  Renin/Aldosterone   Aldosterone 0.0 - 30.0 ng/dL 83.5   Aldos/Renin Ratio 0.0 - 30.0 9.5        Latest Ref Rng & Units 07/06/2023   12:20 PM  Metanephrines/Catecholamines   Epinephrine 0 - 62 pg/mL 48   Norepinephrine 0 - 874 pg/mL 653   Dopamine 0 - 48 pg/mL <30   Metanephrines 0.0 - 88.0 pg/mL <25.0   Normetanephrines  0.0 - 210.1 pg/mL 86.7           07/06/2023   11:36 AM  Renovascular   Renal Artery US  Completed Yes     Disposition:    FU with MD/PharmD/APP in 4 months   Medication Adjustments/Labs and Tests Ordered: Current medicines are reviewed at length with the patient today.  Concerns regarding medicines are outlined above.  No orders of the defined types were placed in this encounter.  No orders of the defined types were placed in this encounter.    Signed, Reche GORMAN Finder, NP  04/24/2024 4:21 PM    [facility]

## 2024-04-24 NOTE — Patient Instructions (Signed)
 Medication Instructions:  Your physician recommends that you continue on your current medications as directed. Please refer to the Current Medication list given to you today.    Follow-Up: Please follow up in 4 months in ADV HTN CLINIC with Dr. Raford, Reche Finder, NP or Allean Mink PharmD

## 2024-05-09 ENCOUNTER — Telehealth: Admitting: Family Medicine

## 2024-05-09 ENCOUNTER — Encounter: Payer: Self-pay | Admitting: Family Medicine

## 2024-05-09 NOTE — Assessment & Plan Note (Signed)
 Obesity, Chronic  BMI is 49.18, decreased from 50.15. Weight fluctuates between 403 and 404 pounds. On naltrexone  50 mg daily and Wellbutrin  150 mg daily with no significant changes in appetite or weight. More active job and some dietary changes, but struggles with unhealthy eating before bed. Insurance issues delay Zepbound  initiation. Exploring coverage or discounts for weight management medications. Discussed metformin and Topamax as alternatives. Avoid phentermine due to hypertension concerns. Over-the-counter options not recommended due to adverse effects like diarrhea. - Continue naltrexone  50 mg daily - Maintain Wellbutrin  150 mg daily - Explore insurance options for Zepbound  or similar medications - Consider metformin or Topamax if needed - Encourage continued dietary modifications and increased physical activity - [employer] policy numbers to the PA team for verification

## 2024-05-09 NOTE — Progress Notes (Signed)
 MyChart Video Visit    Virtual Visit via Video Note   This format is felt to be most appropriate for this patient at this time. Physical exam was limited by quality of the video and audio technology used for the visit.   Patient location: Patient's home address   Provider location: [facility]  [address], [address]  [place], [place] [address]   I discussed the limitations of evaluation and management by telemedicine and the availability of in person appointments. The patient expressed understanding and agreed to proceed.  Patient: Phillip Guerrero   DOB: 20-Feb-1986   38 y.o. Male  MRN: [id number] Visit Date: 05/09/2024  Today's healthcare provider: Rockie Agent, MD   No chief complaint on file.  Subjective    HPI   Discussed the use of AI scribe software for clinical note transcription with the patient, who gave verbal consent to proceed.  History of Present Illness Phillip Guerrero is a 38 year old male who presents for a follow-up on weight management.  His current BMI is 49.18, down from 50.15, and his weight has decreased from 412 pounds to 404 pounds. He is taking naltrexone  50 mg daily and Wellbutrin  150 mg daily but experiences minimal weight loss and no significant change in appetite, continuing to feel hunger.  He has undergone a change in his insurance plan, affecting his ability to get certain medications covered. He is currently on a temporary plan until August 1st, after which he will switch to a [place] plan. He is uncertain about the details of his current insurance coverage and its impact on his medication options.  He reports some lifestyle changes, including a more active job and attempts to eat less during lunch, opting for fruits and water, though he still consumes one or two sodas a day. He acknowledges a habit of eating before bed, often choosing unhealthy options like fast food.    Would like to see if new insurance will  cover     Past Medical History:  Diagnosis Date   Hypertension     Medications: Outpatient Medications Prior to Visit  Medication Sig   amLODipine  (NORVASC ) 5 MG tablet Take 1 tablet (5 mg total) by mouth daily.   buPROPion  (WELLBUTRIN  XL) 150 MG 24 hr tablet Take 1 tablet (150 mg total) by mouth daily.   carvedilol  (COREG ) 25 MG tablet Take 1 tablet (25 mg total) by mouth 2 (two) times daily with a meal.   losartan -hydrochlorothiazide  (HYZAAR) 50-12.5 MG tablet Take 1 tablet by mouth in the morning and at bedtime.   naltrexone  (DEPADE) 50 MG tablet Take 1 tablet (50 mg total) by mouth daily.   Vitamin D , Ergocalciferol , (DRISDOL ) 1.25 MG (50000 UNIT) CAPS capsule Take 1 capsule (50,000 Units total) by mouth every 7 (seven) days.   No facility-administered medications prior to visit.    Review of Systems      Objective    Wt (!) 404 lb (183.3 kg)   BMI 49.18 kg/m   BP Readings from Last 3 Encounters:  04/24/24 (!) 152/82  04/09/24 136/87  02/08/24 (!) 155/105   Wt Readings from Last 3 Encounters:  05/09/24 (!) 404 lb (183.3 kg)  04/24/24 (!) 412 lb (186.9 kg)  04/09/24 (!) 417 lb (189.1 kg)        Physical Exam Vitals reviewed.  Constitutional:      General: He is not in acute distress.    Appearance: Normal appearance. He is not  ill-appearing.  Pulmonary:     Effort: Pulmonary effort is normal. No respiratory distress.  Neurological:     Mental Status: He is alert and oriented to person, place, and time.  Psychiatric:        Mood and Affect: Mood normal.        Behavior: Behavior normal.        Thought Content: Thought content normal.        Assessment & Plan     Problem List Items Addressed This Visit       Other   Morbid obesity (HCC)   Obesity, Chronic  BMI is 49.18, decreased from 50.15. Weight fluctuates between 403 and 404 pounds. On naltrexone  50 mg daily and Wellbutrin  150 mg daily with no significant changes in appetite or weight.  More active job and some dietary changes, but struggles with unhealthy eating before bed. Insurance issues delay Zepbound  initiation. Exploring coverage or discounts for weight management medications. Discussed metformin and Topamax as alternatives. Avoid phentermine due to hypertension concerns. Over-the-counter options not recommended due to adverse effects like diarrhea. - Continue naltrexone  50 mg daily - Maintain Wellbutrin  150 mg daily - Explore insurance options for Zepbound  or similar medications - Consider metformin or Topamax if needed - Encourage continued dietary modifications and increased physical activity - [employer] policy numbers to the PA team for verification       Assessment & Plan   Hypertension Hypertension previously improved. Concern about weight management medications affecting blood pressure, especially phentermine-containing medications. - Avoid phentermine-containing medications to prevent blood pressure elevation     Return in about 6 weeks (around 06/20/2024) for Weight MGMT (GLP1RA with new insurance).     I discussed the assessment and treatment plan with the patient. The patient was provided an opportunity to ask questions and all were answered. The patient agreed with the plan and demonstrated an understanding of the instructions.   The patient was advised to call back or seek an in-person evaluation if the symptoms worsen or if the condition fails to improve as anticipated.  I provided 27 minutes of non-face-to-face time during this encounter.   Rockie Agent, MD [facility] [phone] (phone) [phone] (fax)  [facility]

## 2024-05-17 ENCOUNTER — Other Ambulatory Visit (HOSPITAL_COMMUNITY): Payer: Self-pay

## 2024-05-17 ENCOUNTER — Telehealth: Payer: Self-pay | Admitting: Pharmacy Technician

## 2024-05-17 NOTE — Telephone Encounter (Signed)
 Pharmacy Patient Advocate Encounter   Received notification from Physician's Office that prior authorization for Zepbound  2.5mg /0.4ml auto-inejectos is required/requested.   Insurance verification completed.   The patient is insured through [employer] .   We will need a copy if the patient's insurance card uploaded before we can process the prior authorization. Please have patient upload his card via my chart. Thanks.

## 2024-05-17 NOTE — Telephone Encounter (Signed)
 This is a duplicate encounter.  A message has been sent to pt through mychart on a separate encounter.

## 2024-05-17 NOTE — Telephone Encounter (Signed)
 Patient to call back with subscriber # and group #

## 2024-05-20 ENCOUNTER — Other Ambulatory Visit (HOSPITAL_COMMUNITY): Payer: Self-pay

## 2024-05-22 ENCOUNTER — Other Ambulatory Visit (HOSPITAL_COMMUNITY): Payer: Self-pay

## 2024-06-07 ENCOUNTER — Encounter: Payer: Self-pay | Admitting: Family Medicine

## 2024-06-07 DIAGNOSIS — E559 Vitamin D deficiency, unspecified: Secondary | ICD-10-CM

## 2024-06-07 MED ORDER — VITAMIN D (ERGOCALCIFEROL) 1.25 MG (50000 UNIT) PO CAPS: 50000.0000 [IU] | ORAL_CAPSULE | ORAL | 0 refills | Status: DC

## 2024-06-10 ENCOUNTER — Telehealth: Payer: Self-pay | Admitting: Family Medicine

## 2024-06-10 NOTE — Telephone Encounter (Signed)
[facility] faxed refill request for the following medications:   amLODipine (NORVASC) 5 MG tablet   Please advise.  

## 2024-06-11 ENCOUNTER — Other Ambulatory Visit: Payer: Self-pay

## 2024-06-11 DIAGNOSIS — I10 Essential (primary) hypertension: Secondary | ICD-10-CM

## 2024-06-11 NOTE — Telephone Encounter (Signed)
Converted to refill ?

## 2024-06-17 ENCOUNTER — Telehealth: Payer: Self-pay | Admitting: Family Medicine

## 2024-06-17 NOTE — Telephone Encounter (Signed)
 Prescription sent to pharmacy with refils in July

## 2024-06-17 NOTE — Telephone Encounter (Signed)
 [facility] faxed refill request for the following medications:  Amlodipine  5mg  tab    Please advise.

## 2024-06-20 ENCOUNTER — Encounter (HOSPITAL_BASED_OUTPATIENT_CLINIC_OR_DEPARTMENT_OTHER): Admitting: Family

## 2024-07-12 ENCOUNTER — Encounter: Payer: Self-pay | Admitting: Family Medicine

## 2024-07-12 ENCOUNTER — Telehealth: Payer: Self-pay

## 2024-07-12 ENCOUNTER — Other Ambulatory Visit (HOSPITAL_COMMUNITY): Payer: Self-pay

## 2024-07-12 ENCOUNTER — Ambulatory Visit (INDEPENDENT_AMBULATORY_CARE_PROVIDER_SITE_OTHER): Admitting: Family Medicine

## 2024-07-12 VITALS — BP 159/105 | HR 68 | Temp 97.9°F | Ht 76.0 in | Wt >= 6400 oz

## 2024-07-12 DIAGNOSIS — I10 Essential (primary) hypertension: Secondary | ICD-10-CM | POA: Diagnosis not present

## 2024-07-12 DIAGNOSIS — R7303 Prediabetes: Secondary | ICD-10-CM

## 2024-07-12 DIAGNOSIS — E7800 Pure hypercholesterolemia, unspecified: Secondary | ICD-10-CM

## 2024-07-12 DIAGNOSIS — D229 Melanocytic nevi, unspecified: Secondary | ICD-10-CM

## 2024-07-12 DIAGNOSIS — E559 Vitamin D deficiency, unspecified: Secondary | ICD-10-CM

## 2024-07-12 MED ORDER — SEMAGLUTIDE-WEIGHT MANAGEMENT 0.5 MG/0.5ML ~~LOC~~ SOAJ: 0.5000 mg | SUBCUTANEOUS | 2 refills | Status: DC

## 2024-07-12 MED ORDER — AMLODIPINE BESYLATE 5 MG PO TABS: 5.0000 mg | ORAL_TABLET | Freq: Every day | ORAL | 3 refills | Status: DC

## 2024-07-12 MED ORDER — CLONIDINE HCL 0.1 MG PO TABS: 0.1000 mg | ORAL_TABLET | Freq: Every day | ORAL | 3 refills | Status: DC

## 2024-07-12 NOTE — Telephone Encounter (Signed)
 Pharmacy Patient Advocate Encounter   Received notification from Physician's Office that prior authorization for Wegovy  0.25mg /0.61ml Pen is required/requested.   Insurance verification completed.   The patient is insured through [employer] .   Per test claim: Medication not covered due to plan/benefit exclusion  Pt's secondary insurance is Medicaid Pharmacy Patient Advocate Encounter  Effective October 1st, [place] will discontinue coverage of GLP1 medications for weight loss (such as Wegovy  and Zepbound ), unless the patient has a documented history of a heart attack or stroke. Zepbound  will continue to be covered only for patients with moderate to severe sleep apnea (AHI 15-30). Because of this change, the prior authorization team will not be submitting new PA requests for GLP1 medications prescribed for weight loss between now and October 1st, as patients will be unable to continue therapy under Medicaid coverage.

## 2024-07-12 NOTE — Progress Notes (Signed)
 Established patient visit   Patient: Phillip Guerrero   DOB: Apr 15, 1986   37 y.o. Male  MRN: [id number] Visit Date: 07/12/2024  Today's healthcare provider: Rockie Agent, MD   Chief Complaint  Patient presents with   Medical Management of Chronic Issues   Hypertension    Patient presents for htn follow up, reports need of refill of amlodipine . Patient is occasionally checking it at home- has been out of amlodipine  for 2-3 weeks    Nevus    Patient has mole located on right eyelid, first noticed in 2022. Per patient it was very small and has now grown larger. Would like for this to be removed    Weight Check    Patient would like to discuss starting injections for weight loss, believes his new insurance will cover it    Subjective     HPI     Hypertension    Additional comments: Patient presents for htn follow up, reports need of refill of amlodipine . Patient is occasionally checking it at home- has been out of amlodipine  for 2-3 weeks         Nevus    Additional comments: Patient has mole located on right eyelid, first noticed in 2022. Per patient it was very small and has now grown larger. Would like for this to be removed         Weight Check    Additional comments: Patient would like to discuss starting injections for weight loss, believes his new insurance will cover it       Last edited by Cherry Chiquita HERO, CMA on 07/12/2024  9:49 AM.       Discussed the use of AI scribe software for clinical note transcription with the patient, who gave verbal consent to proceed.  History of Present Illness Phillip Guerrero is a 38 year old male with chronic uncontrolled hypertension who presents for management of elevated blood pressure and evaluation of a mole on his eye.  He has chronic uncontrolled hypertension with a current blood pressure of 159/105 mmHg. His medication regimen includes amlodipine  5 mg daily, carvedilol  25 mg twice daily, losartan  50 mg, and  hydrochlorothiazide  25 mg, taken as one tablet twice daily. He does not check his blood pressure regularly and recalls a recent reading of 176/111 mmHg, which was associated with a headache that resolved after sleeping. He has not followed up with cardiology for several months due to scheduling conflicts and missed appointments.  He has a mole on his eye that began in 2022, described as a 'dot, like a little sharpie dot.' It recently bled after being touched but does not cause pain or itching. The mole is uniform in color but has been growing.  He is considering weight loss options and discusses his current diet and exercise habits, noting challenges with convenience and late-night eating. He has a home gym setup and has recently started using it. He wants to increase physical activity to aid in weight loss. He has been taking Wellbutrin  150 mg daily for appetite control, but it has not been effective, and he has run out of the medication. He also mentions taking naltrexone , which he feels is not effective on its own.  His social history includes a tendency to eat late at night, often after activities such as playing cards. He prefers cooking at home late at night to avoid fast food options.     Past Medical History:  Diagnosis Date   Hypertension  Medications: Outpatient Medications Prior to Visit  Medication Sig   carvedilol  (COREG ) 25 MG tablet Take 1 tablet (25 mg total) by mouth 2 (two) times daily with a meal.   losartan -hydrochlorothiazide  (HYZAAR) 50-12.5 MG tablet Take 1 tablet by mouth in the morning and at bedtime.   Vitamin D , Ergocalciferol , (DRISDOL ) 1.25 MG (50000 UNIT) CAPS capsule Take 1 capsule (50,000 Units total) by mouth every 7 (seven) days.   [DISCONTINUED] amLODipine  (NORVASC ) 5 MG tablet Take 1 tablet (5 mg total) by mouth daily.   [DISCONTINUED] buPROPion  (WELLBUTRIN  XL) 150 MG 24 hr tablet Take 1 tablet (150 mg total) by mouth daily.   [DISCONTINUED] naltrexone   (DEPADE) 50 MG tablet Take 1 tablet (50 mg total) by mouth daily.   No facility-administered medications prior to visit.    Review of Systems  Last CBC Lab Results  Component Value Date   WBC 8.0 06/30/2023   HGB 15.9 06/30/2023   HCT 46.9 06/30/2023   MCV 98 (H) 06/30/2023   MCH 33.2 (H) 06/30/2023   RDW 11.9 06/30/2023   PLT 255 06/30/2023   Last metabolic panel Lab Results  Component Value Date   GLUCOSE 107 (H) 07/12/2024   NA 141 07/12/2024   K 4.1 07/12/2024   CL 105 07/12/2024   CO2 21 07/12/2024   BUN 11 07/12/2024   CREATININE 1.10 07/12/2024   EGFR 89 07/12/2024   CALCIUM 9.1 07/12/2024   PROT 7.2 07/12/2024   ALBUMIN 4.2 07/12/2024   LABGLOB 3.0 07/12/2024   BILITOT 1.7 (H) 07/12/2024   ALKPHOS 62 07/12/2024   AST 19 07/12/2024   ALT 24 07/12/2024   ANIONGAP 10 11/27/2017   Last lipids Lab Results  Component Value Date   CHOL 145 07/12/2024   HDL 35 (L) 07/12/2024   LDLCALC 95 07/12/2024   TRIG 78 07/12/2024   CHOLHDL 4.1 07/12/2024  The ASCVD Risk score (Arnett DK, et al., 2019) failed to calculate for the following reasons:   The 2019 ASCVD risk score is only valid for ages 87 to 45   Last hemoglobin A1c Lab Results  Component Value Date   HGBA1C 5.9 (H) 07/12/2024   Last thyroid functions Lab Results  Component Value Date   TSH 0.973 07/12/2024   Last vitamin D  Lab Results  Component Value Date   VD25OH 25.8 (L) 07/12/2024   Last vitamin B12 and Folate No results found for: VITAMINB12, FOLATE      Objective    BP (!) 159/105 (BP Location: Left Arm, Patient Position: Sitting, Cuff Size: Normal)   Pulse 68   Temp 97.9 F (36.6 C) (Oral)   Ht 6' 4 (1.93 m)   Wt (!) 413 lb 11.2 oz (187.7 kg)   SpO2 98%   BMI 50.36 kg/m   BP Readings from Last 3 Encounters:  07/12/24 (!) 159/105  04/24/24 (!) 152/82  04/09/24 136/87   Wt Readings from Last 3 Encounters:  07/12/24 (!) 413 lb 11.2 oz (187.7 kg)  05/09/24 (!) 404 lb  (183.3 kg)  04/24/24 (!) 412 lb (186.9 kg)        Physical Exam Vitals reviewed.  Constitutional:      General: He is not in acute distress.    Appearance: Normal appearance. He is not ill-appearing.  Cardiovascular:     Rate and Rhythm: Normal rate and regular rhythm.  Pulmonary:     Effort: Pulmonary effort is normal. No respiratory distress.     Breath sounds: No wheezing, rhonchi  or rales.  Neurological:     Mental Status: He is alert and oriented to person, place, and time.  Psychiatric:        Mood and Affect: Mood normal.        Behavior: Behavior normal.       Results for orders placed or performed in visit on 07/12/24  Lipid panel  Result Value Ref Range   Cholesterol, Total 145 100 - 199 mg/dL   Triglycerides 78 0 - 149 mg/dL   HDL 35 (L) >60 mg/dL   VLDL Cholesterol Cal 15 5 - 40 mg/dL   LDL Chol Calc (NIH) 95 0 - 99 mg/dL   Chol/HDL Ratio 4.1 0.0 - 5.0 ratio  CMP14+EGFR  Result Value Ref Range   Glucose 107 (H) 70 - 99 mg/dL   BUN 11 6 - 20 mg/dL   Creatinine, Ser 8.89 0.76 - 1.27 mg/dL   eGFR 89 >40 fO/fpw/8.26   BUN/Creatinine Ratio 10 9 - 20   Sodium 141 134 - 144 mmol/L   Potassium 4.1 3.5 - 5.2 mmol/L   Chloride 105 96 - 106 mmol/L   CO2 21 20 - 29 mmol/L   Calcium 9.1 8.7 - 10.2 mg/dL   Total Protein 7.2 6.0 - 8.5 g/dL   Albumin 4.2 4.1 - 5.1 g/dL   Globulin, Total 3.0 1.5 - 4.5 g/dL   Bilirubin Total 1.7 (H) 0.0 - 1.2 mg/dL   Alkaline Phosphatase 62 47 - 123 IU/L   AST 19 0 - 40 IU/L   ALT 24 0 - 44 IU/L  Hemoglobin A1c  Result Value Ref Range   Hgb A1c MFr Bld 5.9 (H) 4.8 - 5.6 %   Est. average glucose Bld gHb Est-mCnc 123 mg/dL  TSH + free T4  Result Value Ref Range   TSH 0.973 0.450 - 4.500 uIU/mL   Free T4 1.29 0.82 - 1.77 ng/dL  VITAMIN D  25 Hydroxy (Vit-D Deficiency, Fractures)  Result Value Ref Range   Vit D, 25-Hydroxy 25.8 (L) 30.0 - 100.0 ng/mL    Assessment & Plan     Problem List Items Addressed This Visit      Avitaminosis D   Chronic condition Last vitamin d  level was 15   Will order updated vitamin D  levels  Prescribed weekly 50,000 units supplementation        Relevant Orders   VITAMIN D  25 Hydroxy (Vit-D Deficiency, Fractures) (Completed)   Elevated LDL cholesterol level   Chronic  Recheck lipid panel  No current statin therapy  Will discuss at next OV       Morbid obesity (HCC)   Severe obesity, chronic condition  Severe obesity with BMI of 50, contributing to hypertension. Discussed importance of weight loss through increased physical activity and dietary changes. Explored pharmacological options for weight loss, including semaglutide , due to previous insurance denial for Zepbound . Discussed potential cardiac benefits of semaglutide  in relation to hypertension. - Initiate semaglutide  (Wegovy ) at 0.5 mg weekly, pending insurance approval. - Encourage 200-240 minutes of moderate-intensity exercise per week. - Advise on dietary modifications to achieve a calorie deficit and increase protein intake to maintain muscle mass during weight loss. - ordered A1c, CMP, lipids, TSH and T4 levels       Relevant Medications   semaglutide -weight management (WEGOVY ) 0.5 MG/0.5ML SOAJ SQ injection (Start on 08/09/2024)   Other Relevant Orders   Lipid panel (Completed)   CMP14+EGFR (Completed)   Hemoglobin A1c (Completed)   TSH + free T4 (Completed)  Prediabetes   Relevant Orders   Hemoglobin A1c (Completed)   Primary hypertension - Primary   Uncontrolled hypertension Chronic uncontrolled hypertension with current blood pressure at 159/105. 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 Current regimen includes amlodipine , carvedilol , losartan , and hydrochlorothiazide . Recent home reading was 176/111 with associated headache. Discussed impact of weight on hypertension and potential benefits of weight loss. - continue losartan  to 50 mg twice daily while maintaining  hydrochlorothiazide  at 12.5 mg BID. -continue coreg  25mg  BID  - Initiate clonidine  0.1mg  at bedtime to assist with blood pressure control. - Continue current doses of amlodipine  and carvedilol . - Follow up with cardiology in November. - ordered CMP, TSH and T4 levels  - pt does  not want to increase amlodipine  to 10mg  from 5mg  currently due to concern for SE of lower extremity edema  -trial of semaglutide  to help with obesity and weight management       Relevant Medications   amLODipine  (NORVASC ) 5 MG tablet   cloNIDine  (CATAPRES ) 0.1 MG tablet   Other Relevant Orders   CMP14+EGFR (Completed)   Other Visit Diagnoses       Nevus       Relevant Orders   Ambulatory referral to Dermatology       Assessment and Plan Assessment & Plan    Pigmented lesion of right eye, growing Pigmented lesion on right eye, present since 2022, recently bleeding and growing. Uniform in color but concerning due to recent changes. Differential includes benign growth versus malignancy. - Refer to dermatology for evaluation of the pigmented lesion on the right eye.     Return in about 1 month (around 08/11/2024) for HTN, Weight MGMT.         Rockie Agent, MD  [facility] [phone] (phone) [phone] (fax)  [facility]

## 2024-07-12 NOTE — Progress Notes (Signed)
 Pt's new primary insurance will not cover Wegovy  due to plan/benefit exclusion (see test claim below), and medicaid is making changes to their formulary effective October 1st, please see following message regarding medicaid GLP-1 coverage. Thanks  Effective October 1st, Medicaid will discontinue coverage of GLP1 medications for weight loss (such as Wegovy  and Zepbound ), unless the patient has a documented history of a heart attack or stroke. Zepbound  will continue to be covered only for patients with moderate to severe sleep apnea (AHI 15-30). Because of this change, the prior authorization team will not be submitting new PA requests for GLP1 medications prescribed for weight loss between now and October 1st, as patients will be unable to continue therapy under Medicaid coverage.

## 2024-07-13 LAB — CMP14+EGFR
ALT: 24 IU/L (ref 0–44)
AST: 19 IU/L (ref 0–40)
Albumin: 4.2 g/dL (ref 4.1–5.1)
Alkaline Phosphatase: 62 IU/L (ref 47–123)
BUN/Creatinine Ratio: 10 (ref 9–20)
BUN: 11 mg/dL (ref 6–20)
Bilirubin Total: 1.7 mg/dL — ABNORMAL HIGH (ref 0.0–1.2)
CO2: 21 mmol/L (ref 20–29)
Calcium: 9.1 mg/dL (ref 8.7–10.2)
Chloride: 105 mmol/L (ref 96–106)
Creatinine, Ser: 1.1 mg/dL (ref 0.76–1.27)
Globulin, Total: 3 g/dL (ref 1.5–4.5)
Glucose: 107 mg/dL — ABNORMAL HIGH (ref 70–99)
Potassium: 4.1 mmol/L (ref 3.5–5.2)
Sodium: 141 mmol/L (ref 134–144)
Total Protein: 7.2 g/dL (ref 6.0–8.5)
eGFR: 89 mL/min/1.73 (ref >59)

## 2024-07-13 LAB — VITAMIN D 25 HYDROXY (VIT D DEFICIENCY, FRACTURES): Vit D, 25-Hydroxy: 25.8 ng/mL — ABNORMAL LOW (ref 30.0–100.0)

## 2024-07-13 LAB — HEMOGLOBIN A1C
Est. average glucose Bld gHb Est-mCnc: 123 mg/dL
Hgb A1c MFr Bld: 5.9 % — ABNORMAL HIGH (ref 4.8–5.6)

## 2024-07-13 LAB — LIPID PANEL
Chol/HDL Ratio: 4.1 ratio (ref 0.0–5.0)
Cholesterol, Total: 145 mg/dL (ref 100–199)
HDL: 35 mg/dL — ABNORMAL LOW (ref >39)
LDL Chol Calc (NIH): 95 mg/dL (ref 0–99)
Triglycerides: 78 mg/dL (ref 0–149)
VLDL Cholesterol Cal: 15 mg/dL (ref 5–40)

## 2024-07-13 LAB — TSH+FREE T4
Free T4: 1.29 ng/dL (ref 0.82–1.77)
TSH: 0.973 u[IU]/mL (ref 0.450–4.500)

## 2024-07-14 NOTE — Assessment & Plan Note (Addendum)
 Uncontrolled hypertension Chronic uncontrolled hypertension with current blood pressure at 159/105. 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 Current regimen includes amlodipine , carvedilol , losartan , and hydrochlorothiazide . Recent home reading was 176/111 with associated headache. Discussed impact of weight on hypertension and potential benefits of weight loss. - continue losartan  to 50 mg twice daily while maintaining hydrochlorothiazide  at 12.5 mg BID. -continue coreg  25mg  BID  - Initiate clonidine  0.1mg  at bedtime to assist with blood pressure control. - Continue current doses of amlodipine  and carvedilol . - Follow up with cardiology in November. - ordered CMP, TSH and T4 levels  - pt does  not want to increase amlodipine  to 10mg  from 5mg  currently due to concern for SE of lower extremity edema  -trial of semaglutide  to help with obesity and weight management

## 2024-07-14 NOTE — Assessment & Plan Note (Addendum)
 Severe obesity, chronic condition  Severe obesity with BMI of 50, contributing to hypertension. Discussed importance of weight loss through increased physical activity and dietary changes. Explored pharmacological options for weight loss, including semaglutide , due to previous insurance denial for Zepbound . Discussed potential cardiac benefits of semaglutide  in relation to hypertension. - Initiate semaglutide  (Wegovy ) at 0.5 mg weekly, pending insurance approval. - Encourage 200-240 minutes of moderate-intensity exercise per week. - Advise on dietary modifications to achieve a calorie deficit and increase protein intake to maintain muscle mass during weight loss. - ordered A1c, CMP, lipids, TSH and T4 levels

## 2024-07-14 NOTE — Assessment & Plan Note (Signed)
 Chronic condition Last vitamin d  level was 15   Will order updated vitamin D  levels  Prescribed weekly 50,000 units supplementation

## 2024-07-14 NOTE — Assessment & Plan Note (Signed)
 Chronic  Recheck lipid panel  No current statin therapy  Will discuss at next OV

## 2024-07-16 ENCOUNTER — Other Ambulatory Visit: Payer: Self-pay

## 2024-07-16 ENCOUNTER — Ambulatory Visit: Payer: Self-pay | Admitting: Family Medicine

## 2024-07-16 ENCOUNTER — Other Ambulatory Visit (HOSPITAL_COMMUNITY): Payer: Self-pay

## 2024-07-31 ENCOUNTER — Ambulatory Visit

## 2024-07-31 DIAGNOSIS — D22111 Melanocytic nevi of right upper eyelid, including canthus: Secondary | ICD-10-CM | POA: Diagnosis not present

## 2024-07-31 DIAGNOSIS — D229 Melanocytic nevi, unspecified: Secondary | ICD-10-CM

## 2024-07-31 DIAGNOSIS — D22112 Melanocytic nevi of right lower eyelid, including canthus: Secondary | ICD-10-CM | POA: Diagnosis not present

## 2024-07-31 NOTE — Patient Instructions (Signed)

## 2024-07-31 NOTE — Progress Notes (Signed)
    Subjective   Phillip Guerrero is a 38 y.o. male who presents for the following: Lesion(s) of concern . Patient is new patient  Today patient reports: Patient has areas of concern on his right eyelid.   Review of Systems:    No other skin or systemic complaints except as noted in HPI or Assessment and Plan.  The following portions of the chart were reviewed this encounter and updated as appropriate: medications, allergies, medical history  Relevant Medical History:  n/a   Objective  Well appearing patient in no apparent distress; mood and affect are within normal limits. Examination was performed of the: Right eye  with ~1.0 cm brown verrucous exophytic plaque  Examination notable for: Nevus/nevi: Scattered well-demarcated, regular, pigmented macule(s) and/or papule(s)        Assessment & Plan   Lesion of concern right eyelid - nevus vs seborrheic keratosis vs less likely malignant - impacting vision  - Continue active observation - Discussed biopsy today which would only remove small portion of lesion. Patient prefers to have lesion completely excised to confirm dx and remove. Discussed would best be done with plastic surgery.  - Referral sent for plastic surgery   Level of service outlined above   Procedures, orders, diagnosis for this visit:  NEVUS   Related Procedures Ambulatory referral to Plastic Surgery  Nevus -     Ambulatory referral to Plastic Surgery    Return to clinic: Return if symptoms worsen or fail to improve.  Documentation: I, Emerick Ege, CMA am acting as scribe for Lauraine JAYSON Kanaris, MD.   I have reviewed the above documentation for accuracy and completeness, and I agree with the above.  Lauraine JAYSON Kanaris, MD

## 2024-08-01 ENCOUNTER — Ambulatory Visit: Admitting: Family Medicine

## 2024-08-05 ENCOUNTER — Encounter: Payer: Self-pay | Admitting: Plastic Surgery

## 2024-08-05 ENCOUNTER — Ambulatory Visit (INDEPENDENT_AMBULATORY_CARE_PROVIDER_SITE_OTHER): Admitting: Plastic Surgery

## 2024-08-05 VITALS — BP 162/95 | HR 68 | Ht 76.5 in | Wt >= 6400 oz

## 2024-08-05 DIAGNOSIS — L989 Disorder of the skin and subcutaneous tissue, unspecified: Secondary | ICD-10-CM

## 2024-08-05 NOTE — Progress Notes (Signed)
 Referring Provider Sharma Coyer, MD [address] [place],  [place] [address]   CC:  Chief Complaint  Patient presents with   Consult           Phillip Guerrero is an 38 y.o. male.  HPI: Mr. Phillip Guerrero is a 38 year old male who is referred for evaluation and management of a lesion on his right upper eyelid.  He states that he first noticed it in 2022 but has been growing slowly since that time.  He has noted 1 episode where it bled.  It currently interferes with his peripheral vision which is detrimental to his work as a [employer].  He would like to have the lesion removed.  He denies any significant medical problems such as MI, stroke, DVT.  He is treated for hypertension.  No Known Allergies  Outpatient Encounter Medications as of 08/05/2024  Medication Sig   amLODipine  (NORVASC ) 5 MG tablet Take 1 tablet (5 mg total) by mouth daily.   carvedilol  (COREG ) 25 MG tablet Take 1 tablet (25 mg total) by mouth 2 (two) times daily with a meal.   cloNIDine  (CATAPRES ) 0.1 MG tablet Take 1 tablet (0.1 mg total) by mouth at bedtime.   losartan -hydrochlorothiazide  (HYZAAR) 50-12.5 MG tablet Take 1 tablet by mouth in the morning and at bedtime.   Vitamin D , Ergocalciferol , (DRISDOL ) 1.25 MG (50000 UNIT) CAPS capsule Take 1 capsule (50,000 Units total) by mouth every 7 (seven) days.   [START ON 08/09/2024] semaglutide -weight management (WEGOVY ) 0.5 MG/0.5ML SOAJ SQ injection Inject 0.5 mg into the skin once a week. Inject 0.5mg  weekly into skin (Patient not taking: Reported on 08/05/2024)   No facility-administered encounter medications on file as of 08/05/2024.     Past Medical History:  Diagnosis Date   Hypertension     Past Surgical History:  Procedure Laterality Date   FRACTURE SURGERY     TESTICLE TORSION REDUCTION N/A 07/24/2016   Procedure: TESTICULAR TORSION REPAIR;  Surgeon: Belvie LITTIE Clara, MD;  Location: [facility] ORS;  Service: Urology;  Laterality:  N/A;    Family History  Problem Relation Age of Onset   Hypertension Mother    Diabetes Maternal Grandfather     Social History   Social History Narrative   Not on file     Review of Systems General: Denies fevers, chills, weight loss CV: Denies chest pain, shortness of breath, palpitations Skin: Large skin lesion on the right upper eyelid which interferes with his vision  Physical Exam    08/05/2024    2:08 PM 07/12/2024    9:47 AM 05/09/2024    4:05 PM  Vitals with BMI  Height 6' 4.5 6' 4   Weight 409 lbs 13 oz 413 lbs 11 oz 404 lbs  BMI 49.24 50.38 49.2  Systolic 162 159   Diastolic 95 105   Pulse 68 68     General:  No acute distress,  Alert and oriented, Non-Toxic, Normal speech and affect Skin: Patient has a homogeneously pigmented dark lesion on the right upper lid.  It is approximately 1 x 1 cm.  It does hang below the level of the upper lid.   Assessment/Plan Pigmented skin lesion: Patient has a slow-growing skin lesion which I believe should be removed for pathologic diagnosis.  Additionally the lesion interferes with his vision and potentially compromises his work as a [employer].  I discussed the procedure with him including showing him the location of the incision.  We discussed  the risks of bleeding, infection, and need for additional procedures based on pathologic results.  All questions were answered to his satisfaction.  Photographs were obtained today with his consent.  Will schedule him for resection of the lesion in the operating room.  Leonce KATHEE Birmingham 08/05/2024, 2:29 PM

## 2024-08-06 ENCOUNTER — Encounter (HOSPITAL_BASED_OUTPATIENT_CLINIC_OR_DEPARTMENT_OTHER): Payer: Self-pay | Admitting: Plastic Surgery

## 2024-08-06 ENCOUNTER — Other Ambulatory Visit: Payer: Self-pay

## 2024-08-07 ENCOUNTER — Ambulatory Visit: Admitting: Family Medicine

## 2024-08-07 ENCOUNTER — Encounter: Payer: Self-pay | Admitting: Family Medicine

## 2024-08-07 DIAGNOSIS — E559 Vitamin D deficiency, unspecified: Secondary | ICD-10-CM | POA: Diagnosis not present

## 2024-08-07 DIAGNOSIS — I10 Essential (primary) hypertension: Secondary | ICD-10-CM | POA: Diagnosis not present

## 2024-08-07 MED ORDER — CLONIDINE HCL 0.1 MG PO TABS: 0.1000 mg | ORAL_TABLET | Freq: Two times a day (BID) | ORAL | 3 refills | Status: DC

## 2024-08-07 MED ORDER — CARVEDILOL 25 MG PO TABS: 25.0000 mg | ORAL_TABLET | Freq: Two times a day (BID) | ORAL | 3 refills | Status: AC

## 2024-08-07 NOTE — Assessment & Plan Note (Addendum)
  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 Semaglutide  0.5 mg weekly prescribed but not started due to upcoming surgery. Discussed benefits of semaglutide  for appetite suppression and weight loss. Encouraged dietary modifications and meal planning. Aiming for 5% weight reduction in three months. Gradual dose escalation discussed to avoid adverse effects. - Delay initiation of 0.5mg  weekly semaglutide  until post-surgery recovery. Surgery scheduled 08/13/24 - Encourage dietary modifications and meal planning. - Aim for 5% weight loss over the next three months.

## 2024-08-07 NOTE — Patient Instructions (Signed)
 To keep you healthy, please keep in mind the following health maintenance items that you are due for:   There are no preventive care reminders to display for this patient.   Best Wishes,   Dr. Lang

## 2024-08-07 NOTE — Assessment & Plan Note (Signed)
 Vitamin D  deficiency Chronic condition  Vitamin D  deficiency managed with vitamin D  50,000 units weekly. - Continue vitamin D  50,000 units weekly.

## 2024-08-07 NOTE — Assessment & Plan Note (Signed)
 Chronic hypertension with recent readings up to 162/95 mmHg. Current reading is 147/100 mmHg, improved but above target. Current regimen: amlodipine  5 mg daily, carvedilol  25 mg twice daily, clonidine  0.1 mg at bedtime, losartan -hydrochlorothiazide  50-12.5 mg daily. Potential white coat syndrome discussed. - Increase clonidine  to 0.1 mg twice daily. - Continue current antihypertensive regimen. - Monitor blood pressure twice a week and record readings. - Provide refills for clonidine  and carvedilol .

## 2024-08-07 NOTE — Progress Notes (Signed)
 Established patient visit   Patient: Phillip Guerrero   DOB: November 20, 1985   38 y.o. Male  MRN: [id number] Visit Date: 08/07/2024  Today's healthcare provider: Rockie Agent, MD   Chief Complaint  Patient presents with   Medical Management of Chronic Issues    Patient is present for follow up htn and weight mgmt.  Has not taken wegovy  yet, has mole removal scheduled for Tuesday and was instructed not to start it yet. Not checking BP at home   Hypertension   Weight Check   Subjective     HPI     Medical Management of Chronic Issues    Additional comments: Patient is present for follow up htn and weight mgmt.  Has not taken wegovy  yet, has mole removal scheduled for Tuesday and was instructed not to start it yet. Not checking BP at home      Last edited by Cherry Chiquita HERO, CMA on 08/07/2024  8:07 AM.       Discussed the use of AI scribe software for clinical note transcription with the patient, who gave verbal consent to proceed.  History of Present Illness Phillip Guerrero is a 38 year old male with hypertension and class III obesity who presents for follow-up of his hypertension.  His blood pressure has been recorded as high as 162/95 mmHg, and during today's visit, it was 147/100 mmHg, which he acknowledges is an improvement. He is currently on amlodipine  5 mg daily, carvedilol  25 mg twice daily, clonidine  0.1 mg at bedtime, and losartan -hydrochlorothiazide  50-12.5 mg daily. He has not started taking semaglutide  0.5 mg weekly, which was prescribed for weight management.  He has a history of class III obesity with a BMI of 49.35. He is attempting to manage his weight by reducing soda intake and choosing zero-sugar options. He has experienced some weight loss, noting a decrease from 409 lbs to approximately 404-405 lbs at home. He faces challenges with maintaining a healthy diet due to time constraints and being on the road, often resorting to fast food options like  grilled chicken wraps or burgers.  He is scheduled for plastic surgery in one week to remove a lesion from his eyelid.  He has been prescribed vitamin D  50,000 units weekly for vitamin D  deficiency.  He sometimes feels sleepy after taking his medications, particularly at night. He also experiences occasional heartburn, which he attributes to his eating habits.     Past Medical History:  Diagnosis Date   Hypertension    Nevus     Medications: Outpatient Medications Prior to Visit  Medication Sig   amLODipine  (NORVASC ) 5 MG tablet Take 1 tablet (5 mg total) by mouth daily.   losartan -hydrochlorothiazide  (HYZAAR) 50-12.5 MG tablet Take 1 tablet by mouth in the morning and at bedtime.   Vitamin D , Ergocalciferol , (DRISDOL ) 1.25 MG (50000 UNIT) CAPS capsule Take 1 capsule (50,000 Units total) by mouth every 7 (seven) days.   [DISCONTINUED] carvedilol  (COREG ) 25 MG tablet Take 1 tablet (25 mg total) by mouth 2 (two) times daily with a meal.   [DISCONTINUED] cloNIDine  (CATAPRES ) 0.1 MG tablet Take 1 tablet (0.1 mg total) by mouth at bedtime.   [START ON 08/09/2024] semaglutide -weight management (WEGOVY ) 0.5 MG/0.5ML SOAJ SQ injection Inject 0.5 mg into the skin once a week. Inject 0.5mg  weekly into skin (Patient not taking: Reported on 08/07/2024)   No facility-administered medications prior to visit.    Review of Systems  Last CBC Lab Results  Component Value Date   WBC 8.0 06/30/2023   HGB 15.9 06/30/2023   HCT 46.9 06/30/2023   MCV 98 (H) 06/30/2023   MCH 33.2 (H) 06/30/2023   RDW 11.9 06/30/2023   PLT 255 06/30/2023   Last metabolic panel Lab Results  Component Value Date   GLUCOSE 107 (H) 07/12/2024   NA 141 07/12/2024   K 4.1 07/12/2024   CL 105 07/12/2024   CO2 21 07/12/2024   BUN 11 07/12/2024   CREATININE 1.10 07/12/2024   EGFR 89 07/12/2024   CALCIUM 9.1 07/12/2024   PROT 7.2 07/12/2024   ALBUMIN 4.2 07/12/2024   LABGLOB 3.0 07/12/2024   BILITOT 1.7 (H)  07/12/2024   ALKPHOS 62 07/12/2024   AST 19 07/12/2024   ALT 24 07/12/2024   ANIONGAP 10 11/27/2017   Last lipids Lab Results  Component Value Date   CHOL 145 07/12/2024   HDL 35 (L) 07/12/2024   LDLCALC 95 07/12/2024   TRIG 78 07/12/2024   CHOLHDL 4.1 07/12/2024   Last hemoglobin A1c Lab Results  Component Value Date   HGBA1C 5.9 (H) 07/12/2024   Last thyroid functions Lab Results  Component Value Date   TSH 0.973 07/12/2024        Objective    BP (!) 152/94 (Cuff Size: Large)   Pulse 72   Temp 97.8 F (36.6 C) (Oral)   Ht 6' 4.5 (1.943 m)   Wt (!) 410 lb 12.8 oz (186.3 kg)   SpO2 99%   BMI 49.35 kg/m   BP Readings from Last 3 Encounters:  08/07/24 (!) 152/94  08/05/24 (!) 162/95  07/12/24 (!) 159/105   Wt Readings from Last 3 Encounters:  08/06/24 (!) 409 lb 13.4 oz (185.9 kg)  08/07/24 (!) 410 lb 12.8 oz (186.3 kg)  08/05/24 (!) 409 lb 12.8 oz (185.9 kg)        Physical Exam Vitals reviewed.  Constitutional:      General: He is not in acute distress.    Appearance: Normal appearance. He is not ill-appearing.  Cardiovascular:     Rate and Rhythm: Normal rate and regular rhythm.  Pulmonary:     Effort: Pulmonary effort is normal. No respiratory distress.     Breath sounds: No wheezing, rhonchi or rales.  Neurological:     Mental Status: He is alert and oriented to person, place, and time.  Psychiatric:        Mood and Affect: Mood normal.        Behavior: Behavior normal.       No results found for any visits on 08/07/24.  Assessment & Plan     Problem List Items Addressed This Visit     Avitaminosis D   Vitamin D  deficiency Chronic condition  Vitamin D  deficiency managed with vitamin D  50,000 units weekly. - Continue vitamin D  50,000 units weekly.      Morbid obesity (HCC) - Primary    Class 3 obesity (BMI 40+) Chronic condition  Class 3 obesity with BMI 49.35. Semaglutide  0.5 mg weekly prescribed but not started due to  upcoming surgery. Discussed benefits of semaglutide  for appetite suppression and weight loss. Encouraged dietary modifications and meal planning. Aiming for 5% weight reduction in three months. Gradual dose escalation discussed to avoid adverse effects. - Delay initiation of 0.5mg  weekly semaglutide  until post-surgery recovery. Surgery scheduled 08/13/24 - Encourage dietary modifications and meal planning. - Aim for 5% weight loss over the next three months.  Primary hypertension   Chronic hypertension with recent readings up to 162/95 mmHg. Current reading is 147/100 mmHg, improved but above target. Current regimen: amlodipine  5 mg daily, carvedilol  25 mg twice daily, clonidine  0.1 mg at bedtime, losartan -hydrochlorothiazide  50-12.5 mg daily. Potential white coat syndrome discussed. - Increase clonidine  to 0.1 mg twice daily. - Continue current antihypertensive regimen. - Monitor blood pressure twice a week and record readings. - Provide refills for clonidine  and carvedilol .      Relevant Medications   cloNIDine  (CATAPRES ) 0.1 MG tablet   carvedilol  (COREG ) 25 MG tablet     Assessment & Plan       Return in about 2 months (around 10/07/2024) for HTN, Weight MGMT.         Rockie Agent, MD  [facility] [phone] (phone) [phone] (fax)  [facility]

## 2024-08-08 NOTE — Progress Notes (Signed)
 Patient ID: Phillip Guerrero, male    DOB: Mar 15, 1986, 38 y.o.   MRN: [id number]  Chief Complaint  Patient presents with   Pre-op Exam      ICD-10-CM   1. Skin lesion  L98.9        History of Present Illness: Phillip Guerrero is a 38 y.o.  male  with a history of right upper eyelid lesion.  He presents for preoperative evaluation for upcoming procedure, excision of right upper eyelid lesion, scheduled for 08/13/2024 with Dr. Waddell.  The patient has had previous testicular torsion surgery as well as left ankle surgery without complication from anesthesia.  His BP is being managed outpatient by primary care provider as well as cardiology.  He does not smoke or use nicotine containing products.  Wegovy  is on his medication list, but he states that it was not approved through insurance so it does not need to be held.  He denies any personal or family history of blood clots or clotting disorder, history of severe cardiac or pulmonary disease, nicotine use disorder, varicosities, or swollen legs.  Discussed surgery as well as postoperative expectations including right eye periorbital bruising and swelling along with possibility for incisional drainage.  He is understanding and agreeable to plan.  Summary of Previous Visit: Patient met with Dr. Waddell for consult 08/05/2024.  At that time, complained of right upper eyelid lesion that is affecting his peripheral vision.  He reports work as a [employer].  Discussed surgical excision with specimen to be sent for pathology.  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  PMH Significant for: Right upper eyelid lesion, hypertension, hyperlipidemia, morbid obesity on Wegovy , testicular torsion repair, left ankle surgery.   Past Medical History: Allergies: No Known Allergies  Current Medications:  Current Outpatient Medications:    ondansetron  (ZOFRAN -ODT) 4 MG disintegrating tablet, Take 1 tablet (4 mg total) by mouth every  8 (eight) hours as needed for nausea or vomiting., Disp: 20 tablet, Rfl: 0   oxyCODONE  (ROXICODONE ) 5 MG immediate release tablet, Take 1 tablet (5 mg total) by mouth every 8 (eight) hours as needed for up to 2 days for severe pain (pain score 7-10)., Disp: 2 tablet, Rfl: 0   amLODipine  (NORVASC ) 5 MG tablet, Take 1 tablet (5 mg total) by mouth daily., Disp: 90 tablet, Rfl: 3   carvedilol  (COREG ) 25 MG tablet, Take 1 tablet (25 mg total) by mouth 2 (two) times daily with a meal., Disp: 180 tablet, Rfl: 3   cloNIDine  (CATAPRES ) 0.1 MG tablet, Take 1 tablet (0.1 mg total) by mouth 2 (two) times daily., Disp: 180 tablet, Rfl: 3   losartan -hydrochlorothiazide  (HYZAAR) 50-12.5 MG tablet, Take 1 tablet by mouth in the morning and at bedtime., Disp: 180 tablet, Rfl: 3   semaglutide -weight management (WEGOVY ) 0.5 MG/0.5ML SOAJ SQ injection, Inject 0.5 mg into the skin once a week. Inject 0.5mg  weekly into skin (Patient not taking: Reported on 08/07/2024), Disp: 2 mL, Rfl: 2   Vitamin D , Ergocalciferol , (DRISDOL ) 1.25 MG (50000 UNIT) CAPS capsule, Take 1 capsule (50,000 Units total) by mouth every 7 (seven) days., Disp: 12 capsule, Rfl: 0  Past Medical Problems: Past Medical History:  Diagnosis Date   Hypertension    Nevus     Past Surgical History: Past Surgical History:  Procedure Laterality Date   FRACTURE SURGERY     TESTICLE TORSION REDUCTION N/A 07/24/2016   Procedure: TESTICULAR TORSION REPAIR;  Surgeon:  Belvie LITTIE Clara, MD;  Location: [facility] ORS;  Service: Urology;  Laterality: N/A;    Social History: Social History   Socioeconomic History   Marital status: Single    Spouse name: Not on file   Number of children: Not on file   Years of education: Not on file   Highest education level: Not on file  Occupational History   Not on file  Tobacco Use   Smoking status: Never   Smokeless tobacco: Never  Vaping Use   Vaping status: Never Used  Substance and Sexual Activity   Alcohol  use: Yes    Comment: social   Drug use: No   Sexual activity: Yes    Partners: Female    Birth control/protection: Condom  Other Topics Concern   Not on file  Social History Narrative   Not on file   Social Drivers of Health   Financial Resource Strain: Low Risk  (04/09/2024)   Overall Financial Resource Strain (CARDIA)    Difficulty of Paying Living Expenses: Not hard at all  Food Insecurity: No Food Insecurity (04/09/2024)   Hunger Vital Sign    Worried About Running Out of Food in the Last Year: Never true    Ran Out of Food in the Last Year: Never true  Transportation Needs: No Transportation Needs (04/09/2024)   PRAPARE - Administrator, Civil Service (Medical): No    Lack of Transportation (Non-Medical): No  Physical Activity: Sufficiently Active (07/06/2023)   Exercise Vital Sign    Days of Exercise per Week: 6 days    Minutes of Exercise per Session: 30 min  Stress: No Stress Concern Present (04/09/2024)   [employer] of Occupational Health - Occupational Stress Questionnaire    Feeling of Stress: Not at all  Social Connections: Not on file  Intimate Partner Violence: Not At Risk (04/09/2024)   Humiliation, Afraid, Rape, and Kick questionnaire    Fear of Current or Ex-Partner: No    Emotionally Abused: No    Physically Abused: No    Sexually Abused: No    Family History: Family History  Problem Relation Age of Onset   Hypertension Mother    Diabetes Maternal Grandfather     Review of Systems: ROS Denies any recent chest pain, fevers, or difficulty breathing.  Physical Exam: Vital Signs BP (!) 165/90   Pulse 62   SpO2 95%   Physical Exam Constitutional:      General: Not in acute distress.    Appearance: Normal appearance. Not ill-appearing.  HENT:     Head: Normocephalic and atraumatic.  Eyes:     Pupils: Pupils are equal, round. Cardiovascular:     Rate and Rhythm: Normal rate.    Pulses: Normal pulses.  Pulmonary:     Effort:  No respiratory distress or increased work of breathing.  Speaks in full sentences. Abdominal:     General: Abdomen is flat. No distension.   Musculoskeletal: Normal range of motion. No lower extremity swelling or edema. No varicosities. Skin:    General: Skin is warm and dry.     Findings: No erythema or rash.  Neurological:     Mental Status: Alert and oriented to person, place, and time.  Psychiatric:        Mood and Affect: Mood normal.        Behavior: Behavior normal.    Assessment/Plan: The patient is scheduled for excision of right upper eyelid lesion with Dr. Waddell.  Risks, benefits, and  alternatives of procedure discussed, questions answered and consent obtained.    Smoking Status: Non-smoker.  Caprini Score: 4; Risk Factors include: BMI greater than 40 and length of planned surgery. Recommendation for mechanical prophylaxis. Encourage early ambulation.   Pictures obtained: 08/05/2024  Post-op Rx sent to pharmacy: Oxycodone  (#2) and Zofran .  Ibuprofen and Tylenol  as primary medications for pain control and oxycodone  only if needed.  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  Patient was provided with the General Surgical Risk consent document and Pain Medication Agreement prior to their appointment.  They had adequate time to read through the risk consent documents and Pain Medication Agreement. We also discussed them in person together during this preop appointment. All of their questions were answered to their satisfaction.  Recommended calling if they have any further questions.  Risk consent form and Pain Medication Agreement to be scanned into patient's chart.   Electronically signed by: Honora Seip, PA-C 08/09/2024 9:28 AM

## 2024-08-08 NOTE — H&P (View-Only) (Signed)
 Patient ID: Phillip Guerrero, male    DOB: Mar 15, 1986, 38 y.o.   MRN: [id number]  Chief Complaint  Patient presents with   Pre-op Exam      ICD-10-CM   1. Skin lesion  L98.9        History of Present Illness: Phillip Guerrero is a 38 y.o.  male  with a history of right upper eyelid lesion.  He presents for preoperative evaluation for upcoming procedure, excision of right upper eyelid lesion, scheduled for 08/13/2024 with Dr. Waddell.  The patient has had previous testicular torsion surgery as well as left ankle surgery without complication from anesthesia.  His BP is being managed outpatient by primary care provider as well as cardiology.  He does not smoke or use nicotine containing products.  Wegovy  is on his medication list, but he states that it was not approved through insurance so it does not need to be held.  He denies any personal or family history of blood clots or clotting disorder, history of severe cardiac or pulmonary disease, nicotine use disorder, varicosities, or swollen legs.  Discussed surgery as well as postoperative expectations including right eye periorbital bruising and swelling along with possibility for incisional drainage.  He is understanding and agreeable to plan.  Summary of Previous Visit: Patient met with Dr. Waddell for consult 08/05/2024.  At that time, complained of right upper eyelid lesion that is affecting his peripheral vision.  He reports work as a [employer].  Discussed surgical excision with specimen to be sent for pathology.  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  PMH Significant for: Right upper eyelid lesion, hypertension, hyperlipidemia, morbid obesity on Wegovy , testicular torsion repair, left ankle surgery.   Past Medical History: Allergies: No Known Allergies  Current Medications:  Current Outpatient Medications:    ondansetron  (ZOFRAN -ODT) 4 MG disintegrating tablet, Take 1 tablet (4 mg total) by mouth every  8 (eight) hours as needed for nausea or vomiting., Disp: 20 tablet, Rfl: 0   oxyCODONE  (ROXICODONE ) 5 MG immediate release tablet, Take 1 tablet (5 mg total) by mouth every 8 (eight) hours as needed for up to 2 days for severe pain (pain score 7-10)., Disp: 2 tablet, Rfl: 0   amLODipine  (NORVASC ) 5 MG tablet, Take 1 tablet (5 mg total) by mouth daily., Disp: 90 tablet, Rfl: 3   carvedilol  (COREG ) 25 MG tablet, Take 1 tablet (25 mg total) by mouth 2 (two) times daily with a meal., Disp: 180 tablet, Rfl: 3   cloNIDine  (CATAPRES ) 0.1 MG tablet, Take 1 tablet (0.1 mg total) by mouth 2 (two) times daily., Disp: 180 tablet, Rfl: 3   losartan -hydrochlorothiazide  (HYZAAR) 50-12.5 MG tablet, Take 1 tablet by mouth in the morning and at bedtime., Disp: 180 tablet, Rfl: 3   semaglutide -weight management (WEGOVY ) 0.5 MG/0.5ML SOAJ SQ injection, Inject 0.5 mg into the skin once a week. Inject 0.5mg  weekly into skin (Patient not taking: Reported on 08/07/2024), Disp: 2 mL, Rfl: 2   Vitamin D , Ergocalciferol , (DRISDOL ) 1.25 MG (50000 UNIT) CAPS capsule, Take 1 capsule (50,000 Units total) by mouth every 7 (seven) days., Disp: 12 capsule, Rfl: 0  Past Medical Problems: Past Medical History:  Diagnosis Date   Hypertension    Nevus     Past Surgical History: Past Surgical History:  Procedure Laterality Date   FRACTURE SURGERY     TESTICLE TORSION REDUCTION N/A 07/24/2016   Procedure: TESTICULAR TORSION REPAIR;  Surgeon:  Belvie LITTIE Clara, MD;  Location: [facility] ORS;  Service: Urology;  Laterality: N/A;    Social History: Social History   Socioeconomic History   Marital status: Single    Spouse name: Not on file   Number of children: Not on file   Years of education: Not on file   Highest education level: Not on file  Occupational History   Not on file  Tobacco Use   Smoking status: Never   Smokeless tobacco: Never  Vaping Use   Vaping status: Never Used  Substance and Sexual Activity   Alcohol  use: Yes    Comment: social   Drug use: No   Sexual activity: Yes    Partners: Female    Birth control/protection: Condom  Other Topics Concern   Not on file  Social History Narrative   Not on file   Social Drivers of Health   Financial Resource Strain: Low Risk  (04/09/2024)   Overall Financial Resource Strain (CARDIA)    Difficulty of Paying Living Expenses: Not hard at all  Food Insecurity: No Food Insecurity (04/09/2024)   Hunger Vital Sign    Worried About Running Out of Food in the Last Year: Never true    Ran Out of Food in the Last Year: Never true  Transportation Needs: No Transportation Needs (04/09/2024)   PRAPARE - Administrator, Civil Service (Medical): No    Lack of Transportation (Non-Medical): No  Physical Activity: Sufficiently Active (07/06/2023)   Exercise Vital Sign    Days of Exercise per Week: 6 days    Minutes of Exercise per Session: 30 min  Stress: No Stress Concern Present (04/09/2024)   [employer] of Occupational Health - Occupational Stress Questionnaire    Feeling of Stress: Not at all  Social Connections: Not on file  Intimate Partner Violence: Not At Risk (04/09/2024)   Humiliation, Afraid, Rape, and Kick questionnaire    Fear of Current or Ex-Partner: No    Emotionally Abused: No    Physically Abused: No    Sexually Abused: No    Family History: Family History  Problem Relation Age of Onset   Hypertension Mother    Diabetes Maternal Grandfather     Review of Systems: ROS Denies any recent chest pain, fevers, or difficulty breathing.  Physical Exam: Vital Signs BP (!) 165/90   Pulse 62   SpO2 95%   Physical Exam Constitutional:      General: Not in acute distress.    Appearance: Normal appearance. Not ill-appearing.  HENT:     Head: Normocephalic and atraumatic.  Eyes:     Pupils: Pupils are equal, round. Cardiovascular:     Rate and Rhythm: Normal rate.    Pulses: Normal pulses.  Pulmonary:     Effort:  No respiratory distress or increased work of breathing.  Speaks in full sentences. Abdominal:     General: Abdomen is flat. No distension.   Musculoskeletal: Normal range of motion. No lower extremity swelling or edema. No varicosities. Skin:    General: Skin is warm and dry.     Findings: No erythema or rash.  Neurological:     Mental Status: Alert and oriented to person, place, and time.  Psychiatric:        Mood and Affect: Mood normal.        Behavior: Behavior normal.    Assessment/Plan: The patient is scheduled for excision of right upper eyelid lesion with Dr. Waddell.  Risks, benefits, and  alternatives of procedure discussed, questions answered and consent obtained.    Smoking Status: Non-smoker.  Caprini Score: 4; Risk Factors include: BMI greater than 40 and length of planned surgery. Recommendation for mechanical prophylaxis. Encourage early ambulation.   Pictures obtained: 08/05/2024  Post-op Rx sent to pharmacy: Oxycodone  (#2) and Zofran .  Ibuprofen and Tylenol  as primary medications for pain control and oxycodone  only if needed.  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  Patient was provided with the General Surgical Risk consent document and Pain Medication Agreement prior to their appointment.  They had adequate time to read through the risk consent documents and Pain Medication Agreement. We also discussed them in person together during this preop appointment. All of their questions were answered to their satisfaction.  Recommended calling if they have any further questions.  Risk consent form and Pain Medication Agreement to be scanned into patient's chart.   Electronically signed by: Honora Seip, PA-C 08/09/2024 9:28 AM

## 2024-08-09 ENCOUNTER — Ambulatory Visit: Admitting: Physician Assistant

## 2024-08-09 ENCOUNTER — Encounter (HOSPITAL_BASED_OUTPATIENT_CLINIC_OR_DEPARTMENT_OTHER)
Admission: RE | Admit: 2024-08-09 | Discharge: 2024-08-09 | Disposition: A | Discharge status: Home / Self Care | Source: Ambulatory Visit | Attending: Plastic Surgery | Admitting: Plastic Surgery

## 2024-08-09 ENCOUNTER — Telehealth: Payer: Self-pay

## 2024-08-09 VITALS — BP 165/90 | HR 62

## 2024-08-09 DIAGNOSIS — L989 Disorder of the skin and subcutaneous tissue, unspecified: Secondary | ICD-10-CM

## 2024-08-09 DIAGNOSIS — Z01812 Encounter for preprocedural laboratory examination: Secondary | ICD-10-CM | POA: Insufficient documentation

## 2024-08-09 DIAGNOSIS — Z79899 Other long term (current) drug therapy: Secondary | ICD-10-CM | POA: Insufficient documentation

## 2024-08-09 LAB — BASIC METABOLIC PANEL WITH GFR
Anion gap: 10 (ref 5–15)
BUN: 7 mg/dL (ref 6–20)
CO2: 23 mmol/L (ref 22–32)
Calcium: 8.8 mg/dL — ABNORMAL LOW (ref 8.9–10.3)
Chloride: 105 mmol/L (ref 98–111)
Creatinine, Ser: 0.88 mg/dL (ref 0.61–1.24)
GFR, Estimated: >60 mL/min (ref >60)
Glucose, Bld: 104 mg/dL — ABNORMAL HIGH (ref 70–99)
Potassium: 4 mmol/L (ref 3.5–5.1)
Sodium: 138 mmol/L (ref 135–145)

## 2024-08-09 MED ORDER — OXYCODONE HCL 5 MG PO TABS: 5.0000 mg | ORAL_TABLET | Freq: Three times a day (TID) | ORAL | 0 refills | Status: AC | PRN

## 2024-08-09 MED ORDER — ONDANSETRON 4 MG PO TBDP: 4.0000 mg | ORAL_TABLET | Freq: Three times a day (TID) | ORAL | 0 refills | Status: DC | PRN

## 2024-08-09 NOTE — Progress Notes (Signed)
 Chart reviewed by Dr. Dorethea with updated height and weight, ok to proceed as planned with surgery at [facility].

## 2024-08-09 NOTE — Telephone Encounter (Signed)
 Received a call from the [facility], Jasmine. She had questions regarding naltrexone  and the oxycodone  interaction. Spoke with patient, he is not taking that or any other weight loss medication.  I informed Jasmine and she will go ahead and fill patient's pain medication.

## 2024-08-13 ENCOUNTER — Ambulatory Visit (HOSPITAL_BASED_OUTPATIENT_CLINIC_OR_DEPARTMENT_OTHER): Admitting: Anesthesiology

## 2024-08-13 ENCOUNTER — Other Ambulatory Visit: Payer: Self-pay

## 2024-08-13 ENCOUNTER — Ambulatory Visit (HOSPITAL_BASED_OUTPATIENT_CLINIC_OR_DEPARTMENT_OTHER)
Admission: RE | Admit: 2024-08-13 | Discharge: 2024-08-13 | Disposition: A | Discharge status: Home / Self Care | Attending: Plastic Surgery | Admitting: Plastic Surgery

## 2024-08-13 ENCOUNTER — Encounter (HOSPITAL_BASED_OUTPATIENT_CLINIC_OR_DEPARTMENT_OTHER): Payer: Self-pay | Admitting: Plastic Surgery

## 2024-08-13 ENCOUNTER — Encounter (HOSPITAL_BASED_OUTPATIENT_CLINIC_OR_DEPARTMENT_OTHER)
Admission: RE | Disposition: A | Discharge status: Home / Self Care | Payer: Self-pay | Source: Home / Self Care | Attending: Plastic Surgery

## 2024-08-13 DIAGNOSIS — E66813 Obesity, class 3: Secondary | ICD-10-CM | POA: Diagnosis not present

## 2024-08-13 DIAGNOSIS — Z79899 Other long term (current) drug therapy: Secondary | ICD-10-CM | POA: Diagnosis not present

## 2024-08-13 DIAGNOSIS — L821 Other seborrheic keratosis: Secondary | ICD-10-CM | POA: Diagnosis not present

## 2024-08-13 DIAGNOSIS — L989 Disorder of the skin and subcutaneous tissue, unspecified: Secondary | ICD-10-CM | POA: Diagnosis present

## 2024-08-13 DIAGNOSIS — Z6841 Body Mass Index (BMI) 40.0 and over, adult: Secondary | ICD-10-CM | POA: Insufficient documentation

## 2024-08-13 DIAGNOSIS — I10 Essential (primary) hypertension: Secondary | ICD-10-CM | POA: Insufficient documentation

## 2024-08-13 HISTORY — PX: EXCISION MASS HEAD: SHX6702

## 2024-08-13 HISTORY — DX: Melanocytic nevi, unspecified: D22.9

## 2024-08-13 MED ORDER — MIDAZOLAM HCL 2 MG/2ML IJ SOLN
INTRAMUSCULAR | Status: AC
  Filled 2024-08-13: qty 2

## 2024-08-13 MED ORDER — BUPIVACAINE-EPINEPHRINE (PF) 0.25% -1:200000 IJ SOLN
INTRAMUSCULAR | Status: AC
  Filled 2024-08-13: qty 30

## 2024-08-13 MED ORDER — OXYCODONE HCL 5 MG/5ML PO SOLN: 5.0000 mg | Freq: Once | ORAL | Status: DC | PRN

## 2024-08-13 MED ORDER — CHLORHEXIDINE GLUCONATE CLOTH 2 % EX PADS: 6.0000 | MEDICATED_PAD | Freq: Once | CUTANEOUS | Status: DC

## 2024-08-13 MED ORDER — KETOROLAC TROMETHAMINE 30 MG/ML IJ SOLN: 30.0000 mg | Freq: Once | INTRAMUSCULAR | Status: DC | PRN

## 2024-08-13 MED ORDER — CEFAZOLIN SODIUM-DEXTROSE 3-4 GM/150ML-% IV SOLN
3.0000 g | INTRAVENOUS | Status: AC
  Administered 2024-08-13: 3 g via INTRAVENOUS

## 2024-08-13 MED ORDER — ACETAMINOPHEN 500 MG PO TABS
1000.0000 mg | ORAL_TABLET | Freq: Once | ORAL | Status: AC
  Administered 2024-08-13: 1000 mg via ORAL

## 2024-08-13 MED ORDER — DEXMEDETOMIDINE HCL IN NACL 80 MCG/20ML IV SOLN
INTRAVENOUS | Status: DC | PRN
  Administered 2024-08-13: 8 ug via INTRAVENOUS

## 2024-08-13 MED ORDER — FENTANYL CITRATE (PF) 100 MCG/2ML IJ SOLN
INTRAMUSCULAR | Status: DC | PRN
  Administered 2024-08-13: 50 ug via INTRAVENOUS

## 2024-08-13 MED ORDER — 0.9 % SODIUM CHLORIDE (POUR BTL) OPTIME
TOPICAL | Status: DC | PRN
  Administered 2024-08-13: 75 mL
  Administered 2024-08-13: 250 mL

## 2024-08-13 MED ORDER — GLYCOPYRROLATE 0.2 MG/ML IJ SOLN
INTRAMUSCULAR | Status: DC | PRN
  Administered 2024-08-13: .1 mg via INTRAVENOUS

## 2024-08-13 MED ORDER — ACETAMINOPHEN 500 MG PO TABS
ORAL_TABLET | ORAL | Status: AC
  Filled 2024-08-13: qty 2

## 2024-08-13 MED ORDER — PROPOFOL 10 MG/ML IV BOLUS
INTRAVENOUS | Status: DC | PRN
Start: 2024-08-13 — End: 2024-08-13
  Administered 2024-08-13: 300 mg via INTRAVENOUS

## 2024-08-13 MED ORDER — FENTANYL CITRATE (PF) 100 MCG/2ML IJ SOLN: 25.0000 ug | INTRAMUSCULAR | Status: DC | PRN

## 2024-08-13 MED ORDER — CEFAZOLIN SODIUM-DEXTROSE 3-4 GM/150ML-% IV SOLN
INTRAVENOUS | Status: AC
  Filled 2024-08-13: qty 150

## 2024-08-13 MED ORDER — LACTATED RINGERS IV SOLN: INTRAVENOUS | Status: DC

## 2024-08-13 MED ORDER — AMISULPRIDE (ANTIEMETIC) 5 MG/2ML IV SOLN: 10.0000 mg | Freq: Once | INTRAVENOUS | Status: DC | PRN

## 2024-08-13 MED ORDER — KETOROLAC TROMETHAMINE 30 MG/ML IJ SOLN
INTRAMUSCULAR | Status: AC
  Filled 2024-08-13: qty 1

## 2024-08-13 MED ORDER — MIDAZOLAM HCL (PF) 2 MG/2ML IJ SOLN
INTRAMUSCULAR | Status: DC | PRN
  Administered 2024-08-13: 2 mg via INTRAVENOUS

## 2024-08-13 MED ORDER — ARTIFICIAL TEARS OPHTHALMIC OINT
TOPICAL_OINTMENT | OPHTHALMIC | Status: AC
  Filled 2024-08-13: qty 10.5

## 2024-08-13 MED ORDER — BUPIVACAINE-EPINEPHRINE 0.25% -1:200000 IJ SOLN
INTRAMUSCULAR | Status: DC | PRN
  Administered 2024-08-13: 1 mL

## 2024-08-13 MED ORDER — OXYCODONE HCL 5 MG PO TABS: 5.0000 mg | ORAL_TABLET | Freq: Once | ORAL | Status: DC | PRN

## 2024-08-13 MED ORDER — ONDANSETRON HCL 4 MG/2ML IJ SOLN
INTRAMUSCULAR | Status: DC | PRN
  Administered 2024-08-13: 4 mg via INTRAVENOUS

## 2024-08-13 MED ORDER — LIDOCAINE HCL (CARDIAC) PF 100 MG/5ML IV SOSY
PREFILLED_SYRINGE | INTRAVENOUS | Status: DC | PRN
  Administered 2024-08-13: 100 mg via INTRAVENOUS

## 2024-08-13 MED ORDER — FENTANYL CITRATE (PF) 100 MCG/2ML IJ SOLN
INTRAMUSCULAR | Status: AC
  Filled 2024-08-13: qty 2

## 2024-08-13 NOTE — Discharge Instructions (Signed)
 No tylenol  until 6:00 p.m.  Post Anesthesia Home Care Instructions  Activity: Get plenty of rest for the remainder of the day. A responsible individual must stay with you for 24 hours following the procedure.  For the next 24 hours, DO NOT: -Drive a car -Advertising copywriter -Drink alcoholic beverages -Take any medication unless instructed by your physician -Make any legal decisions or sign important papers.  Meals: Start with liquid foods such as gelatin or soup. Progress to regular foods as tolerated. Avoid greasy, spicy, heavy foods. If nausea and/or vomiting occur, drink only clear liquids until the nausea and/or vomiting subsides. Call your physician if vomiting continues.  Special Instructions/Symptoms: Your throat may feel dry or sore from the anesthesia or the breathing tube placed in your throat during surgery. If this causes discomfort, gargle with warm salt water. The discomfort should disappear within 24 hours.  If you had a scopolamine patch placed behind your ear for the management of post- operative nausea and/or vomiting:  1. The medication in the patch is effective for 72 hours, after which it should be removed.  Wrap patch in a tissue and discard in the trash. Wash hands thoroughly with soap and water. 2. You may remove the patch earlier than 72 hours if you experience unpleasant side effects which may include dry mouth, dizziness or visual disturbances. 3. Avoid touching the patch. Wash your hands with soap and water after contact with the patch.

## 2024-08-13 NOTE — Interval H&P Note (Signed)
 History and Physical Interval Note: No change in exam or indication for surgery All questions answered Site marked with his concurrence Will proceed at his request  08/13/2024 1:07 PM  Phillip Guerrero  has presented today for surgery, with the diagnosis of L98.9.  The various methods of treatment have been discussed with the patient and family. After consideration of risks, benefits and other options for treatment, the patient has consented to  Procedure(s) with comments: EXCISION, MASS, HEAD (N/A) - Resection right upper eyelid skin lesion as a surgical intervention.  The patient's history has been reviewed, patient examined, no change in status, stable for surgery.  I have reviewed the patient's chart and labs.  Questions were answered to the patient's satisfaction.     Leonce KATHEE [place]

## 2024-08-13 NOTE — Op Note (Signed)
 DATE OF OPERATION: 08/13/2024  LOCATION: [facility] operating Room  PREOPERATIVE DIAGNOSIS: Right eyelid skin lesion  POSTOPERATIVE DIAGNOSIS: Same  PROCEDURE: Excision of right skin lesion  SURGEON: Marinell Naresh Althaus,MD  ASSISTANT: Honora Seip  EBL: 2 cc  CONDITION: Stable  COMPLICATIONS: None  INDICATION: The patient, Phillip Guerrero, is a 38 y.o. male born on 12-Jul-1986, is here for excision of a skin lesion on the right eyelid for pathologic diagnosis.   PROCEDURE DETAILS:  The patient was seen prior to surgery and marked.  The IV antibiotics were given. The patient was taken to the operating room and given a general anesthetic. A standard time out was performed and all information was confirmed by those in the room. SCDs were placed.   The right eyelid was prepped and draped in usual sterile manner.  1 mL of quarter percent Marcaine  with epinephrine was injected into the subcu epidermal space.  A corneal protector was placed.  The skin lesion was excised sharply and passed off the operative field.  Hemostasis was achieved with the electrocautery.  The wound was closed with interrupted and running 5-0 fast absorbing chromic gut sutures.  The wound measured approximately 1.5 cm in length.  The incision was coated with ophthalmic bacitracin and the corneal protector was removed.  The patient was awakened from anesthesia without incident transferred to the recovery room in good condition.  All sponge counts were reported as correct and there were no complications appreciated during the procedure. The patient was allowed to wake up and taken to recovery room in stable condition at the end of the case. The family was notified at the end of the case.   The advanced practice practitioner (APP) assisted throughout the case.  The APP was essential in retraction and counter traction when needed to make the case progress smoothly.  This retraction and assistance made it possible to see the  tissue plans for the procedure.  The assistance was needed for blood control, tissue re-approximation and assisted with closure of the incision site.

## 2024-08-13 NOTE — Anesthesia Postprocedure Evaluation (Signed)
 Anesthesia Post Note  Patient: Phillip Guerrero  Procedure(s) Performed: EXCISION, MASS, HEAD (Right: Eye)     Patient location during evaluation: PACU Anesthesia Type: General Level of consciousness: awake Pain management: pain level controlled Vital Signs Assessment: post-procedure vital signs reviewed and stable Respiratory status: spontaneous breathing, nonlabored ventilation and respiratory function stable Cardiovascular status: blood pressure returned to baseline and stable Postop Assessment: no apparent nausea or vomiting Anesthetic complications: no   No notable events documented.  Last Vitals:  Vitals:   08/13/24 1500 08/13/24 1515  BP:  130/83  Pulse:  60  Resp:  16  Temp:  36.5 C  SpO2: 94% 93%    Last Pain:  Vitals:   08/13/24 1515  TempSrc:   PainSc: 0-No pain                 Phillip Guerrero

## 2024-08-13 NOTE — Anesthesia Preprocedure Evaluation (Addendum)
 Anesthesia Evaluation  Patient identified by MRN, date of birth, ID band Patient awake    Reviewed: Allergy & Precautions, NPO status , Patient's Chart, lab work & pertinent test results  Airway Mallampati: II  TM Distance: >3 FB Neck ROM: Full    Dental no notable dental hx.    Pulmonary neg pulmonary ROS   Pulmonary exam normal        Cardiovascular hypertension, Pt. on medications and Pt. on home beta blockers Normal cardiovascular exam     Neuro/Psych negative neurological ROS  negative psych ROS   GI/Hepatic negative GI ROS, Neg liver ROS,,,  Endo/Other    Class 3 obesity  Renal/GU negative Renal ROS     Musculoskeletal negative musculoskeletal ROS (+)    Abdominal  (+) + obese  Peds  Hematology negative hematology ROS (+)   Anesthesia Other Findings right upper eyelid skin lesion  Reproductive/Obstetrics                              Anesthesia Physical Anesthesia Plan  ASA: 3  Anesthesia Plan: General   Post-op Pain Management:    Induction: Intravenous  PONV Risk Score and Plan: 2 and Ondansetron , Dexamethasone, Midazolam and Treatment may vary due to age or medical condition  Airway Management Planned: Oral ETT  Additional Equipment:   Intra-op Plan:   Post-operative Plan: Extubation in OR  Informed Consent: I have reviewed the patients History and Physical, chart, labs and discussed the procedure including the risks, benefits and alternatives for the proposed anesthesia with the patient or authorized representative who has indicated his/her understanding and acceptance.     Dental advisory given  Plan Discussed with: CRNA  Anesthesia Plan Comments:         Anesthesia Quick Evaluation

## 2024-08-13 NOTE — Transfer of Care (Signed)
 Immediate Anesthesia Transfer of Care Note  Patient: Phillip Guerrero  Procedure(s) Performed: EXCISION, MASS, HEAD (Right: Eye)  Patient Location: PACU  Anesthesia Type:General  Level of Consciousness: awake and patient cooperative  Airway & Oxygen Therapy: Patient Spontanous Breathing and Patient connected to nasal cannula oxygen  Post-op Assessment: Report given to RN and Post -op Vital signs reviewed and stable  Post vital signs: Reviewed and stable  Last Vitals:  Vitals Value Taken Time  BP 116/80 08/13/24 14:30  Temp 36.4 C 08/13/24 14:30  Pulse 66 08/13/24 14:35  Resp 18 08/13/24 14:34  SpO2 91 % 08/13/24 14:35  Vitals shown include unfiled device data.  Last Pain:  Vitals:   08/13/24 1430  TempSrc:   PainSc: Asleep      Patients Stated Pain Goal: 0 (08/13/24 1153)  Complications: No notable events documented.

## 2024-08-13 NOTE — Anesthesia Procedure Notes (Addendum)
 Procedure Name: LMA Insertion Date/Time: 08/13/2024 1:47 PM  Performed by: Donnell Berwyn SQUIBB, CRNAPre-anesthesia Checklist: Patient identified, Emergency Drugs available, Suction available, Patient being monitored and Timeout performed Patient Re-evaluated:Patient Re-evaluated prior to induction Oxygen Delivery Method: Circle system utilized Preoxygenation: Pre-oxygenation with 100% oxygen Induction Type: IV induction LMA: LMA with gastric port inserted LMA Size: 5.0 Number of attempts: 1 Placement Confirmation: breath sounds checked- equal and bilateral Tube secured with: Tape Dental Injury: Teeth and Oropharynx as per pre-operative assessment

## 2024-08-14 ENCOUNTER — Encounter (HOSPITAL_BASED_OUTPATIENT_CLINIC_OR_DEPARTMENT_OTHER): Payer: Self-pay | Admitting: Plastic Surgery

## 2024-08-14 LAB — SURGICAL PATHOLOGY

## 2024-08-16 ENCOUNTER — Encounter: Payer: Self-pay | Admitting: Family Medicine

## 2024-08-16 ENCOUNTER — Encounter (HOSPITAL_BASED_OUTPATIENT_CLINIC_OR_DEPARTMENT_OTHER): Payer: Self-pay

## 2024-08-16 DIAGNOSIS — E7800 Pure hypercholesterolemia, unspecified: Secondary | ICD-10-CM

## 2024-08-16 DIAGNOSIS — I10 Essential (primary) hypertension: Secondary | ICD-10-CM

## 2024-08-16 MED ORDER — TIRZEPATIDE-WEIGHT MANAGEMENT 5 MG/0.5ML ~~LOC~~ SOAJ: 5.0000 mg | SUBCUTANEOUS | 3 refills | Status: DC

## 2024-08-16 NOTE — Telephone Encounter (Signed)
 Pt reports insurance denial for wegovy . Would zepbound  be an option for BMI 48 (class III obesity) associated with comorbid resistant HTN

## 2024-08-19 ENCOUNTER — Telehealth: Payer: Self-pay | Admitting: Pharmacy Technician

## 2024-08-19 ENCOUNTER — Encounter: Payer: Self-pay | Admitting: Plastic Surgery

## 2024-08-19 ENCOUNTER — Other Ambulatory Visit (HOSPITAL_COMMUNITY): Payer: Self-pay

## 2024-08-19 ENCOUNTER — Ambulatory Visit (INDEPENDENT_AMBULATORY_CARE_PROVIDER_SITE_OTHER): Admitting: Plastic Surgery

## 2024-08-19 ENCOUNTER — Telehealth: Payer: Self-pay

## 2024-08-19 VITALS — BP 154/97 | HR 70 | Ht 76.5 in | Wt >= 6400 oz

## 2024-08-19 DIAGNOSIS — L821 Other seborrheic keratosis: Secondary | ICD-10-CM

## 2024-08-19 NOTE — Telephone Encounter (Signed)
 Pharmacy Patient Advocate Encounter   Received notification from Pt Calls Messages that prior authorization for Zepbound  5mg /0.27ml pen is required/requested.   Insurance verification completed.   The patient is insured through [employer].   Per test claim: Per test claim, medication is not covered due to plan/benefit exclusion, PA not submitted at this time

## 2024-08-19 NOTE — Telephone Encounter (Signed)
 Copied from CRM [id number]. Topic: Clinical - Medication Prior Auth >> Aug 16, 2024  4:49 PM Tinnie C wrote: Reason for CRM: Jonette from [employer] calling to ask that I provide prior auth team with this information: You may request PA by contacting their PA team at [phone] They can also submit it electronically [email or web address]. >> Aug 16, 2024  5:07 PM Winona R wrote: Pt calling to verify [employer] has called with their information as listed above.

## 2024-08-19 NOTE — Progress Notes (Signed)
 Mr. Comp returns today after excision of a skin lesion on his right eyelid.  He had no issues postoperatively.  He is doing well now.  On examination the incision has some crusted blood over it but is otherwise well-approximated.  We reviewed the pathology which was a seborrheic keratosis.  Recommendation: He may use a warm moist compress or dilute peroxide to get the remainder of the dried blood off of the incision.  The sutures are a rapid dissolving gut however if they do not go away he may return and I will remove them for him.  I have requested that he follow-up with his dermatologist as he probably should have regular skin checks.  No further treatment is needed for the seborrheic keratosis.  His blood pressure is slightly elevated today.  We also discussed this.  We discussed the importance of lifestyle modification to get his weight down which will also help with his blood pressure.  He is supposed to speak with his primary care physician regarding weight loss medications however I have encouraged him to consider modifying his diet as well as increasing his level of exercise.  He was offered a referral to healthy weight and wellness but is deferred at this time.  He may call either me or Dr. Marcine Essex for referral at his convenience if he so chooses.  Follow-up as needed.

## 2024-08-21 ENCOUNTER — Telehealth: Payer: Self-pay

## 2024-08-21 NOTE — Telephone Encounter (Unsigned)
 Copied from CRM [id number]. Topic: Clinical - Prescription Issue >> Aug 20, 2024  3:30 PM Roselie BROCKS wrote: Reason for CRM: Patient calling for status update on a  prior authorization has been approved for Zepbound  yet. The Patient requests a return call concerning this

## 2024-08-28 ENCOUNTER — Encounter (HOSPITAL_BASED_OUTPATIENT_CLINIC_OR_DEPARTMENT_OTHER): Payer: Self-pay

## 2024-08-29 ENCOUNTER — Encounter (HOSPITAL_BASED_OUTPATIENT_CLINIC_OR_DEPARTMENT_OTHER): Payer: Self-pay | Admitting: Family

## 2024-08-29 ENCOUNTER — Telehealth: Payer: Self-pay | Admitting: Pharmacy Technician

## 2024-08-29 ENCOUNTER — Other Ambulatory Visit (HOSPITAL_COMMUNITY): Payer: Self-pay

## 2024-08-29 ENCOUNTER — Ambulatory Visit (INDEPENDENT_AMBULATORY_CARE_PROVIDER_SITE_OTHER): Admitting: Family

## 2024-08-29 VITALS — BP 144/72 | HR 70 | Ht 76.5 in | Wt >= 6400 oz

## 2024-08-29 DIAGNOSIS — I1A0 Resistant hypertension: Secondary | ICD-10-CM

## 2024-08-29 MED ORDER — OLMESARTAN MEDOXOMIL-HCTZ 20-12.5 MG PO TABS: 1.0000 | ORAL_TABLET | Freq: Two times a day (BID) | ORAL | 1 refills | Status: DC

## 2024-08-29 NOTE — Progress Notes (Unsigned)
 [facility] Assessment:    Date:  08/29/2024   ID:  Yancy JAYSON Gavel, DOB April 26, 1986, MRN [id number]  PCP:  Sharma Coyer, MD  Cardiologist:  None  Nephrologist:  Referring MD: Sharma Bullocks*   CC: Hypertension  History of Present Illness:    JACQUESE HACKMAN is a 38 y.o. male with a hx of hypertension, obesity, prediabetes, vitamin D  deficiency here to follow up  in the [facility].   Referred by PCP 06/2023 due to uncontrolled hypertension. Losartan -hydrochlorothiazide  previously switched to valsartan -hydrochlorothiazide  but was found to be ineffective. Amlodipine  was added at visit 06/30/23. Additionally prescribed Zepbound  for weight loss.  Established with [facility] 07/06/23. Diagnosed with hypertension a few months prior. Zepbound  found to be cost prohibitive. Carvedilol  increased to 6.25mg  BID. Renal artery duplex ordered, but no showed. No tobacco use, alcohol use socially. Renin-aldosterone, catecholamines, metanephrines normal. He was enrolled in [employer] research study recommended for monthly OV x 4 months which were not completed. Re-established care 01/25/24 at which time Carvedilol  increased to 25mg  BID.   Presents today for follow up independently. Has not been monitoring BP at home. He consumed an energy drink today, which may have elevated his blood pressure. He experiences a sensation of something 'stretching' in his throat/upper chest when taking his medication. No chest pain, lightheadedness, or dizziness.  He notes a change in urination pattern since starting blood pressure medication, urinating less frequently at night and during the day despite adequate water intake.  He is a futures trader and drives flatbed truck. No formal exercise routine. He consumes energy drinks, specifically Red Bull and V8. Often eating convenience foods higher in sodium.   148/84  [employer] bin  [phone] Grp EX CNC PIN [id number] Member ID [id number]  Previous antihypertensives: Valsartan -HCTZ - ineffective  Past Medical History:  Diagnosis Date   Hypertension    Nevus     Past Surgical History:  Procedure Laterality Date   EXCISION MASS HEAD Right 08/13/2024   Procedure: EXCISION, MASS, HEAD;  Surgeon: Waddell Leonce NOVAK, MD;  Location:  SURGERY CENTER;  Service: Plastics;  Laterality: Right;  Resection right upper eyelid skin lesion   FRACTURE SURGERY     TESTICLE TORSION REDUCTION N/A 07/24/2016   Procedure: TESTICULAR TORSION REPAIR;  Surgeon: Belvie LITTIE Clara, MD;  Location: [facility] ORS;  Service: Urology;  Laterality: N/A;    Current Medications: Current Meds  Medication Sig   amLODipine  (NORVASC ) 5 MG tablet Take 1 tablet (5 mg total) by mouth daily.   carvedilol  (COREG ) 25 MG tablet Take 1 tablet (25 mg total) by mouth 2 (two) times daily with a meal.   cloNIDine  (CATAPRES ) 0.1 MG tablet Take 1 tablet (0.1 mg total) by mouth 2 (two) times daily.   olmesartan-hydrochlorothiazide  (BENICAR HCT) 20-12.5 MG tablet Take 1 tablet by mouth in the morning and at bedtime.   Vitamin D , Ergocalciferol , (DRISDOL ) 1.25 MG (50000 UNIT) CAPS capsule Take 1 capsule (50,000 Units total) by mouth every 7 (seven) days.   [DISCONTINUED] losartan -hydrochlorothiazide  (HYZAAR) 50-12.5 MG tablet Take 1 tablet by mouth in the morning and at bedtime.     Allergies:   Patient has no known allergies.   Social History   Socioeconomic History   Marital status: Single    Spouse name: Not on file   Number of children: Not on file   Years of education: Not on file   Highest education level: Not on file  Occupational  History   Not on file  Tobacco Use   Smoking status: Never   Smokeless tobacco: Never  Vaping Use   Vaping status: Never Used  Substance and Sexual Activity   Alcohol use: Yes    Comment: social   Drug use: No   Sexual activity: Yes    Partners: Female    Birth  control/protection: Condom  Other Topics Concern   Not on file  Social History Narrative   Not on file   Social Drivers of Health   Financial Resource Strain: Low Risk  (04/09/2024)   Overall Financial Resource Strain (CARDIA)    Difficulty of Paying Living Expenses: Not hard at all  Food Insecurity: No Food Insecurity (04/09/2024)   Hunger Vital Sign    Worried About Running Out of Food in the Last Year: Never true    Ran Out of Food in the Last Year: Never true  Transportation Needs: No Transportation Needs (04/09/2024)   PRAPARE - Administrator, Civil Service (Medical): No    Lack of Transportation (Non-Medical): No  Physical Activity: Sufficiently Active (07/06/2023)   Exercise Vital Sign    Days of Exercise per Week: 6 days    Minutes of Exercise per Session: 30 min  Stress: No Stress Concern Present (04/09/2024)   [employer] of Occupational Health - Occupational Stress Questionnaire    Feeling of Stress: Not at all  Social Connections: Not on file     Family History: The patient's family history includes Diabetes in his maternal grandfather; Hypertension in his mother.  ROS:   Please see the history of present illness.     All other systems reviewed and are negative.  EKGs/Labs/Other Studies Reviewed:         Recent Labs: 07/12/2024: ALT 24; TSH 0.973 08/09/2024: BUN 7; Creatinine, Ser 0.88; Potassium 4.0; Sodium 138   Recent Lipid Panel    Component Value Date/Time   CHOL 145 07/12/2024 1041   TRIG 78 07/12/2024 1041   HDL 35 (L) 07/12/2024 1041   CHOLHDL 4.1 07/12/2024 1041   LDLCALC 95 07/12/2024 1041    Physical Exam:   VS:  Pulse 70   Ht 6' 4.5 (1.943 m)   Wt (!) 410 lb 6.4 oz (186.2 kg)   SpO2 98%   BMI 49.30 kg/m  , BMI Body mass index is 49.3 kg/m.  Vitals:   08/29/24 1528  Pulse: 70  Height: 6' 4.5 (1.943 m)  Weight: (!) 410 lb 6.4 oz (186.2 kg)  SpO2: 98%  BMI (Calculated): 49.31    GENERAL:  Well appearing,  overweight HEENT: Pupils equal round and reactive, fundi not visualized, oral mucosa unremarkable NECK:  No jugular venous distention, waveform within normal limits, carotid upstroke brisk and symmetric, no bruits, no thyromegaly LYMPHATICS:  No cervical adenopathy LUNGS:  Clear to auscultation bilaterally HEART:  RRR.  PMI not displaced or sustained,S1 and S2 within normal limits, no S3, no S4, no clicks, no rubs, no murmurs ABD:  Flat, positive bowel sounds normal in frequency in pitch, no bruits, no rebound, no guarding, no midline pulsatile mass, no hepatomegaly, no splenomegaly EXT:  2 plus pulses throughout, no edema, no cyanosis no clubbing SKIN:  No rashes no nodules NEURO:  Cranial nerves II through XII grossly intact, motor grossly intact throughout PSYCH:  Cognitively intact, oriented to person place and time   ASSESSMENT/PLAN:    HTN - BP not at goal <130/80. Declines additional medication changes today preferring lifestyle  changes. Recommend aiming for 150 minutes of moderate intensity activity per week and following a heart healthy diet.  Continue Losartan -hydrochlorothiazide  50-12.5mg  BID. Valsartan -hydrochlorothiazide  previously ineffective. Continue Amlodipine  5mg  daily, he is hesitant to increase due to possible side effect of edema. Continue Coreg  25mg  BID.   Discussed to monitor BP at home at least 2 hours after medications and sitting for 5-10 minutes.  Secondary workup: No snoring suggestive of OSA 05/2023 normal TSH 06/2023 renin-aldosterone, metanephrines, catecholamines unremarkable.  02/2024 no renal artery stenosis  Recommend aiming for 150 minutes of moderate intensity activity per week and following a heart healthy diet.    Obesity - Weight loss via diet and exercise encouraged. Discussed the impact being overweight would have on cardiovascular risk. Zepbound  previously cost prohibitive. Establishing with new insurance and plans to re-attempt Zepbound  PA with PCP.  ***  Screening for Secondary Hypertension:     07/06/2023   12:54 PM  Causes  Renovascular HTN Screened  Sleep Apnea N/A     - Comments no snoring nor daytime somnolence  Thyroid Disease Screened     - Comments 05/2023 normal TSH  Hyperaldosteronism Screened  Pheochromocytoma Screened  Coarctation of the Aorta N/A     - Comments BP symmetrical  Compliance Screened    Relevant Labs/Studies:    Latest Ref Rng & Units 08/09/2024   10:00 AM 07/12/2024   10:41 AM 06/30/2023    1:11 PM  Basic Labs  Sodium 135 - 145 mmol/L 138  141  141   Potassium 3.5 - 5.1 mmol/L 4.0  4.1  3.8   Creatinine 0.61 - 1.24 mg/dL 9.11  8.89  8.88        Latest Ref Rng & Units 07/12/2024   10:41 AM 06/30/2023    1:11 PM  Thyroid   TSH 0.450 - 4.500 uIU/mL 0.973  1.260        Latest Ref Rng & Units 07/06/2023   12:20 PM  Renin/Aldosterone   Aldosterone 0.0 - 30.0 ng/dL 83.5   Aldos/Renin Ratio 0.0 - 30.0 9.5        Latest Ref Rng & Units 07/06/2023   12:20 PM  Metanephrines/Catecholamines   Epinephrine 0 - 62 pg/mL 48   Norepinephrine 0 - 874 pg/mL 653   Dopamine 0 - 48 pg/mL <30   Metanephrines 0.0 - 88.0 pg/mL <25.0   Normetanephrines  0.0 - 210.1 pg/mL 86.7             Disposition:    FU with MD/PharmD/APP in 4 months   Medication Adjustments/Labs and Tests Ordered: Current medicines are reviewed at length with the patient today.  Concerns regarding medicines are outlined above.  Orders Placed This Encounter  Procedures   Basic metabolic panel with GFR   Meds ordered this encounter  Medications   olmesartan-hydrochlorothiazide  (BENICAR HCT) 20-12.5 MG tablet    Sig: Take 1 tablet by mouth in the morning and at bedtime.    Dispense:  180 tablet    Refill:  1    Stop losartan -hctz    Supervising Provider:   LONNI SLAIN [[id number]]     Signed, Reche GORMAN Finder, NP  08/29/2024 4:15 PM    [facility]

## 2024-08-29 NOTE — Patient Instructions (Addendum)
 Medication Instructions:  STOP Losartan -hydrochlorothiazide   START Olmesartan-hydrochlorothiazide  20-12.5mg  TWICE per day   Labwork: Your physician recommends that you return for lab work in: 1-2 weeks for BMET     Follow-Up: Please follow up in 6 weeks in [facility] with Dr. Raford, Reche Finder, NP or Allean Mink PharmD    Special Instructions:   Tips to Measure your Blood Pressure Correctly  Here's what you can do to ensure a correct reading:  Don't drink a caffeinated beverage or smoke during the 30 minutes before the test.  Sit quietly for five minutes before the test begins.  During the measurement, sit in a chair with your feet on the floor and your arm supported so your elbow is at about heart level.  The inflatable part of the cuff should completely cover at least 80% of your upper arm, and the cuff should be placed on bare skin, not over a shirt.  Don't talk during the measurement.  Have your blood pressure measured twice, with a brief break in between. If the readings are different by 5 points or more, have it done a third time.  Blood pressure categories  Blood pressure category SYSTOLIC (upper number)  DIASTOLIC (lower number)  Normal Less than 120 mm Hg and Less than 80 mm Hg  Elevated 120-129 mm Hg and Less than 80 mm Hg  High blood pressure: Stage 1 hypertension 130-139 mm Hg or 80-89 mm Hg  High blood pressure: Stage 2 hypertension 140 mm Hg or higher or 90 mm Hg or higher  Hypertensive crisis (consult your doctor immediately) Higher than 180 mm Hg and/or Higher than 120 mm Hg  Source: American Heart Association and American Stroke Association. For more on getting your blood pressure under control, buy Controlling Your Blood Pressure, a Special Health Report from [facility].

## 2024-08-29 NOTE — Telephone Encounter (Signed)
 For wegovy :   for zepbound :   No sleep study found and no record of mi/stroke found. Message sent to provider

## 2024-08-30 ENCOUNTER — Encounter (HOSPITAL_BASED_OUTPATIENT_CLINIC_OR_DEPARTMENT_OTHER): Payer: Self-pay

## 2024-09-03 ENCOUNTER — Other Ambulatory Visit (HOSPITAL_COMMUNITY): Payer: Self-pay

## 2024-09-03 ENCOUNTER — Telehealth: Payer: Self-pay | Admitting: Pharmacist

## 2024-09-03 DIAGNOSIS — I1A0 Resistant hypertension: Secondary | ICD-10-CM

## 2024-09-03 MED ORDER — OLMESARTAN MEDOXOMIL-HCTZ 40-25 MG PO TABS: 1.0000 | ORAL_TABLET | Freq: Every day | ORAL | 1 refills | Status: AC

## 2024-09-03 NOTE — Telephone Encounter (Signed)
 Patient prescribed olmesartan/hydrochlorothiazide  20-12.5mg  BID. Insurance will only pay for once daily administration. Will switch to olmesartan/hydrochlorothiazide  40-25mg  once daily

## 2024-09-03 NOTE — Progress Notes (Unsigned)
 No show

## 2024-09-04 ENCOUNTER — Ambulatory Visit: Admitting: Family Medicine

## 2024-09-04 ENCOUNTER — Encounter: Admitting: Physician Assistant

## 2024-09-06 ENCOUNTER — Ambulatory Visit: Admitting: Family Medicine

## 2024-09-09 ENCOUNTER — Other Ambulatory Visit (HOSPITAL_COMMUNITY): Payer: Self-pay

## 2024-09-10 ENCOUNTER — Other Ambulatory Visit (HOSPITAL_COMMUNITY): Payer: Self-pay

## 2024-09-24 ENCOUNTER — Ambulatory Visit: Admitting: Family Medicine

## 2024-09-24 ENCOUNTER — Encounter: Payer: Self-pay | Admitting: Family Medicine

## 2024-09-24 VITALS — BP 154/88 | HR 71 | Ht 76.5 in | Wt >= 6400 oz

## 2024-09-24 DIAGNOSIS — I10 Essential (primary) hypertension: Secondary | ICD-10-CM

## 2024-09-24 DIAGNOSIS — E559 Vitamin D deficiency, unspecified: Secondary | ICD-10-CM

## 2024-09-24 MED ORDER — CLONIDINE HCL 0.1 MG PO TABS: 0.1000 mg | ORAL_TABLET | Freq: Two times a day (BID) | ORAL | 3 refills | Status: DC

## 2024-09-24 MED ORDER — AMLODIPINE BESYLATE 5 MG PO TABS: 5.0000 mg | ORAL_TABLET | Freq: Every day | ORAL | 3 refills | Status: DC

## 2024-09-24 MED ORDER — VITAMIN D (ERGOCALCIFEROL) 1.25 MG (50000 UNIT) PO CAPS: 50000.0000 [IU] | ORAL_CAPSULE | ORAL | 1 refills | Status: AC

## 2024-09-24 NOTE — Assessment & Plan Note (Signed)
 Resistant hypertension Chronic resistant hypertension with recent blood pressure readings of 154/88 mmHg and 144/100 mmHg. Current regimen includes amlodipine , carvedilol , clonidine , and olmesartan . Losartan  was discontinued. Blood pressure control is improving but remains elevated. Cardiologist involved in management. Discussed potential side effects of olmesartan , including increased potassium and kidney function changes. - Continue amlodipine  5 mg daily - Continue carvedilol  25 mg twice daily - Continue clonidine  0.1 mg twice daily - Continue olmesartan  40/25 mg daily - Ordered basic metabolic panel to monitor kidney function and potassium levels

## 2024-09-24 NOTE — Assessment & Plan Note (Signed)
 Vitamin D  deficiency Chronic  Previous low levels. He has not been taking vitamin D  supplements recently. - Prescribed high-dose vitamin D  supplementation

## 2024-09-24 NOTE — Patient Instructions (Signed)
 To keep you healthy, please keep in mind the following health maintenance items that you are due for:   There are no preventive care reminders to display for this patient.   Best Wishes,   Dr. Lang

## 2024-09-24 NOTE — Assessment & Plan Note (Signed)
  Class III obesity Chronic  Recent weight gain of 5 pounds. Current weight is 404 pounds, down from 412 pounds. Previous weight management attempts with Wellbutrin  and naltrexone  were ineffective. Insurance does not cover tirzepatide  or semaglutide . Discussed avoiding caffeine and stimulants due to potential impact on blood pressure. Encouraged exercise and dietary modifications. Discussed potential benefits of consulting a nutritionist or personal trainer for dietary guidance. - Encouraged continued exercise regimen - Advised against caffeine and stimulants - Discussed potential consultation with a nutritionist or personal trainer for dietary guidance

## 2024-09-24 NOTE — Progress Notes (Signed)
 Established patient visit   Patient: Phillip Guerrero   DOB: 10-03-1986   38 y.o. Male  MRN: [id number] Visit Date: 09/24/2024  Today's healthcare provider: Rockie Agent, MD   Chief Complaint  Patient presents with   Hypertension   Weight Check    Patient is doing well, not taking zepbound  due to insurance not wanting to cover this. Patient has been going to the gym and feeling well    Subjective     HPI     Weight Check    Additional comments: Patient is doing well, not taking zepbound  due to insurance not wanting to cover this. Patient has been going to the gym and feeling well       Last edited by Cherry Chiquita HERO, CMA on 09/24/2024  8:14 AM.       Discussed the use of AI scribe software for clinical note transcription with the patient, who gave verbal consent to proceed.  History of Present Illness Phillip Guerrero is a 38 year old male with resistant hypertension who presents for a follow-up visit.  He has a history of resistant hypertension and is currently on a regimen that includes amlodipine  5 mg daily, carvedilol  25 mg twice daily, clonidine  0.1 mg twice daily, and olmesartan  40/25 mg daily. His blood pressure readings have been variable, with recent measurements of 150/88 mmHg and 144/100 mmHg. He feels well and has been exercising regularly.  He reports his current weight is 404 pounds at home, down from 412 pounds, although the clinic scale showed a higher weight. His current weight is 404 pounds at home, down from 412 pounds. He acknowledges not eating as he should and mentions the recent Thanksgiving holiday as a factor. He is working on [employer] through exercise and dietary changes, although insurance does not cover certain weight loss medications like tirzepatide  and semaglutide .  No significant symptoms of sleep apnea, such as loud snoring or waking up with a dry mouth. He has been taking beet pills and is considering beet powder for  additional benefits. He has not been taking vitamin D  supplements recently but had previously completed a prescribed course. He is interested in weight management and has tried Wellbutrin  and naltrexone  in the past without success.    Past Medical History:  Diagnosis Date   Hypertension    Nevus     Medications: Outpatient Medications Prior to Visit  Medication Sig Note   carvedilol  (COREG ) 25 MG tablet Take 1 tablet (25 mg total) by mouth 2 (two) times daily with a meal.    olmesartan -hydrochlorothiazide  (BENICAR  HCT) 40-25 MG tablet Take 1 tablet by mouth daily.    [DISCONTINUED] amLODipine  (NORVASC ) 5 MG tablet Take 1 tablet (5 mg total) by mouth daily.    [DISCONTINUED] cloNIDine  (CATAPRES ) 0.1 MG tablet Take 1 tablet (0.1 mg total) by mouth 2 (two) times daily.    [DISCONTINUED] ondansetron  (ZOFRAN -ODT) 4 MG disintegrating tablet Take 1 tablet (4 mg total) by mouth every 8 (eight) hours as needed for nausea or vomiting.    [DISCONTINUED] tirzepatide  (ZEPBOUND ) 5 MG/0.5ML Pen Inject 5 mg into the skin once a week.    [DISCONTINUED] Vitamin D , Ergocalciferol , (DRISDOL ) 1.25 MG (50000 UNIT) CAPS capsule Take 1 capsule (50,000 Units total) by mouth every 7 (seven) days. (Patient not taking: Reported on 09/24/2024) 09/24/2024: recommended transitioning to OTC Vitamin D    No facility-administered medications prior to visit.    Review of Systems  Last CBC Lab Results  Component Value Date   WBC 8.0 06/30/2023   HGB 15.9 06/30/2023   HCT 46.9 06/30/2023   MCV 98 (H) 06/30/2023   MCH 33.2 (H) 06/30/2023   RDW 11.9 06/30/2023   PLT 255 06/30/2023   Last metabolic panel Lab Results  Component Value Date   GLUCOSE 104 (H) 08/09/2024   NA 138 08/09/2024   K 4.0 08/09/2024   CL 105 08/09/2024   CO2 23 08/09/2024   BUN 7 08/09/2024   CREATININE 0.88 08/09/2024   GFRNONAA >60 08/09/2024   CALCIUM 8.8 (L) 08/09/2024   PROT 7.2 07/12/2024   ALBUMIN 4.2 07/12/2024   LABGLOB 3.0  07/12/2024   BILITOT 1.7 (H) 07/12/2024   ALKPHOS 62 07/12/2024   AST 19 07/12/2024   ALT 24 07/12/2024   ANIONGAP 10 08/09/2024   Last lipids Lab Results  Component Value Date   CHOL 145 07/12/2024   HDL 35 (L) 07/12/2024   LDLCALC 95 07/12/2024   TRIG 78 07/12/2024   CHOLHDL 4.1 07/12/2024   Last hemoglobin A1c Lab Results  Component Value Date   HGBA1C 5.9 (H) 07/12/2024   Last thyroid functions Lab Results  Component Value Date   TSH 0.973 07/12/2024   FREET4 1.29 07/12/2024   Last vitamin D  Lab Results  Component Value Date   VD25OH 25.8 (L) 07/12/2024   Last vitamin B12 and Folate No results found for: VITAMINB12, FOLATE      Objective    BP (!) 154/88 (BP Location: Left Arm, Patient Position: Sitting, Cuff Size: Normal) Comment: manual  Pulse 71   Ht 6' 4.5 (1.943 m)   Wt (!) 415 lb 11.2 oz (188.6 kg)   SpO2 99%   BMI 49.94 kg/m  BP Readings from Last 3 Encounters:  09/24/24 (!) 154/88  08/29/24 (!) 144/72  08/19/24 (!) 154/97   Wt Readings from Last 3 Encounters:  09/24/24 (!) 415 lb 11.2 oz (188.6 kg)  08/29/24 (!) 410 lb 6.4 oz (186.2 kg)  08/19/24 (!) 414 lb 9.6 oz (188.1 kg)        Physical Exam Vitals reviewed.  Constitutional:      General: He is not in acute distress.    Appearance: Normal appearance. He is not ill-appearing.  Cardiovascular:     Rate and Rhythm: Normal rate and regular rhythm.  Pulmonary:     Effort: Pulmonary effort is normal. No respiratory distress.     Breath sounds: No wheezing, rhonchi or rales.  Neurological:     Mental Status: He is alert and oriented to person, place, and time.  Psychiatric:        Mood and Affect: Mood normal.        Behavior: Behavior normal.       No results found for any visits on 09/24/24.  Assessment & Plan     Problem List Items Addressed This Visit     Avitaminosis D   Vitamin D  deficiency Chronic  Previous low levels. He has not been taking vitamin D   supplements recently. - Prescribed high-dose vitamin D  supplementation      Relevant Medications   Vitamin D , Ergocalciferol , (DRISDOL ) 1.25 MG (50000 UNIT) CAPS capsule   Morbid obesity (HCC)    Class III obesity Chronic  Recent weight gain of 5 pounds. Current weight is 404 pounds, down from 412 pounds. Previous weight management attempts with Wellbutrin  and naltrexone  were ineffective. Insurance does not cover tirzepatide  or semaglutide . Discussed avoiding caffeine and stimulants due to potential impact  on blood pressure. Encouraged exercise and dietary modifications. Discussed potential benefits of consulting a nutritionist or personal trainer for dietary guidance. - Encouraged continued exercise regimen - Advised against caffeine and stimulants - Discussed potential consultation with a nutritionist or personal trainer for dietary guidance      RESOLVED: Primary hypertension   Relevant Medications   amLODipine  (NORVASC ) 5 MG tablet   cloNIDine  (CATAPRES ) 0.1 MG tablet   Severe uncontrolled hypertension - Primary   Resistant hypertension Chronic resistant hypertension with recent blood pressure readings of 154/88 mmHg and 144/100 mmHg. Current regimen includes amlodipine , carvedilol , clonidine , and olmesartan . Losartan  was discontinued. Blood pressure control is improving but remains elevated. Cardiologist involved in management. Discussed potential side effects of olmesartan , including increased potassium and kidney function changes. - Continue amlodipine  5 mg daily - Continue carvedilol  25 mg twice daily - Continue clonidine  0.1 mg twice daily - Continue olmesartan  40/25 mg daily - Ordered basic metabolic panel to monitor kidney function and potassium levels      Relevant Medications   amLODipine  (NORVASC ) 5 MG tablet   cloNIDine  (CATAPRES ) 0.1 MG tablet   Other Relevant Orders   BMP8+EGFR    Assessment and Plan Assessment & Plan        Return in about 3 months  (around 12/23/2024) for weight f/u (lifestyle), HTN,vitamin D  .         Rockie Agent, MD  [facility] [phone] (phone) [phone] (fax)  [facility]

## 2024-09-25 ENCOUNTER — Ambulatory Visit: Payer: Self-pay | Admitting: Family Medicine

## 2024-09-25 LAB — BMP8+EGFR
BUN/Creatinine Ratio: 7 — ABNORMAL LOW (ref 9–20)
BUN: 8 mg/dL (ref 6–20)
CO2: 20 mmol/L (ref 20–29)
Calcium: 9 mg/dL (ref 8.7–10.2)
Chloride: 104 mmol/L (ref 96–106)
Creatinine, Ser: 1.08 mg/dL (ref 0.76–1.27)
Glucose: 133 mg/dL — ABNORMAL HIGH (ref 70–99)
Potassium: 4 mmol/L (ref 3.5–5.2)
Sodium: 140 mmol/L (ref 134–144)
eGFR: 91 mL/min/1.73 (ref >59)

## 2024-10-08 ENCOUNTER — Ambulatory Visit (INDEPENDENT_AMBULATORY_CARE_PROVIDER_SITE_OTHER): Admitting: Family

## 2024-10-08 ENCOUNTER — Encounter (HOSPITAL_BASED_OUTPATIENT_CLINIC_OR_DEPARTMENT_OTHER): Payer: Self-pay | Admitting: Family

## 2024-10-08 VITALS — BP 142/90 | HR 75 | Ht 76.5 in | Wt >= 6400 oz

## 2024-10-08 DIAGNOSIS — I10 Essential (primary) hypertension: Secondary | ICD-10-CM

## 2024-10-08 MED ORDER — AMLODIPINE BESYLATE 5 MG PO TABS: 5.0000 mg | ORAL_TABLET | Freq: Every day | ORAL | 3 refills | Status: AC

## 2024-10-08 MED ORDER — CLONIDINE HCL 0.1 MG PO TABS: 0.1000 mg | ORAL_TABLET | Freq: Two times a day (BID) | ORAL | 3 refills | Status: AC

## 2024-10-08 NOTE — Patient Instructions (Addendum)
 Medication Instructions:   Recommend Flonase daily for 2 weeks thereafter you can use as-needed to help with nasal congestion    Follow-Up: Please follow up in 2-3 months in [facility] with Dr. Raford, Reche Finder, NP or Allean Mink PharmD    Special Instructions:   Tips to Measure your Blood Pressure Correctly  Here's what you can do to ensure a correct reading:  Don't drink a caffeinated beverage or smoke during the 30 minutes before the test.  Sit quietly for five minutes before the test begins.  During the measurement, sit in a chair with your feet on the floor and your arm supported so your elbow is at about heart level.  The inflatable part of the cuff should completely cover at least 80% of your upper arm, and the cuff should be placed on bare skin, not over a shirt.  Don't talk during the measurement.  Have your blood pressure measured twice, with a brief break in between. If the readings are different by 5 points or more, have it done a third time.  In 2017, new guidelines from the American Heart Association, the [employer] of Cardiology, and nine other health organizations lowered the diagnosis of high blood pressure to 130/80 mm Hg or higher for all adults. The guidelines also redefined the various blood pressure categories to now include normal, elevated, Stage 1 hypertension, Stage 2 hypertension, and hypertensive crisis (see Blood pressure categories).  Blood pressure categories  Blood pressure category SYSTOLIC (upper number)  DIASTOLIC (lower number)  Normal Less than 120 mm Hg and Less than 80 mm Hg  Elevated 120-129 mm Hg and Less than 80 mm Hg  High blood pressure: Stage 1 hypertension 130-139 mm Hg or 80-89 mm Hg  High blood pressure: Stage 2 hypertension 140 mm Hg or higher or 90 mm Hg or higher  Hypertensive crisis (consult your doctor immediately) Higher than 180 mm Hg and/or Higher than 120 mm Hg  Source: American Heart Association and  American Stroke Association. For more on getting your blood pressure under control, buy Controlling Your Blood Pressure, a Special Health Report from [facility].   Blood Pressure Log   Date   Time  Blood Pressure  Position  Example: Nov 1 9 AM 124/78 sitting

## 2024-10-08 NOTE — Progress Notes (Signed)
 [facility] Assessment:    Date:  10/08/2024   ID:  ADIEL ERNEY, DOB [birth year], MRN [id number]  PCP:  Sharma Coyer, MD  Cardiologist:  None  Nephrologist:  Referring MD: Sharma Coyer, MD   CC: Hypertension  History of Present Illness:    ARYAMAN HALIBURTON is a 38 y.o. male with a hx of hypertension, obesity, prediabetes, vitamin D  deficiency here to follow up  in the [facility].   Referred by PCP 06/2023 due to uncontrolled hypertension. Losartan -hydrochlorothiazide  previously switched to valsartan -hydrochlorothiazide  but was found to be ineffective. Amlodipine  was added at visit 06/30/23. Additionally prescribed Zepbound  for weight loss.  Established with [facility] 07/06/23. Diagnosed with hypertension a few months prior. Zepbound  found to be cost prohibitive. Carvedilol  increased to 6.25mg  BID. Renal artery duplex ordered, but no showed. No tobacco use, alcohol use socially. Renin-aldosterone, catecholamines, metanephrines normal. He was enrolled in [employer] research study recommended for monthly OV x 4 months which were not completed. Re-established care 01/25/24 at which time Carvedilol  increased to 25mg  BID.   At visit 04/24/24 BP 152/82, not at goal, declined medication changes and preferred to work on lifestyle changes.   Most recent visit 08/29/24. Losartan -hydrochlorothiazide  was stopped and recommended for Olmesartan -hydrochlorothiazide  20-12.5mg  BID. Based on insurance preference, adjusted to Olmesartan -hydrochlorothiazide  40-25mg  daily. Coreg  25mg  BID, Clonidine  0.1mg   BID, Amlodipine  5mg  daily continued. Labs with PCP 09/24/24 creatinine 1.08, GFR 91, K 4.0.   Presents today for follow up. 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 BP at home has been 138/81, 136/78, 140/76. Reports no shortness of breath nor dyspnea on exertion. Does note he feels like he has something  stopped in his nose and planning for sinus rinse. Reports no chest pain, pressure, or tightness. No edema, orthopnea, PND. Reports no palpitations.  Sleeping well at night. Has turned his garage into a gym and has been exercising regularly.   Previous antihypertensives: Valsartan -HCTZ - ineffective  Past Medical History:  Diagnosis Date   Hypertension    Nevus     Past Surgical History:  Procedure Laterality Date   EXCISION MASS HEAD Right 08/13/2024   Procedure: EXCISION, MASS, HEAD;  Surgeon: Waddell Leonce NOVAK, MD;  Location: [facility];  Service: Plastics;  Laterality: Right;  Resection right upper eyelid skin lesion   FRACTURE SURGERY     TESTICLE TORSION REDUCTION N/A 07/24/2016   Procedure: TESTICULAR TORSION REPAIR;  Surgeon: Belvie LITTIE Clara, MD;  Location: [facility];  Service: Urology;  Laterality: N/A;    Current Medications: Current Meds  Medication Sig   carvedilol  (COREG ) 25 MG tablet Take 1 tablet (25 mg total) by mouth 2 (two) times daily with a meal.   olmesartan -hydrochlorothiazide  (BENICAR  HCT) 40-25 MG tablet Take 1 tablet by mouth daily.   Vitamin D , Ergocalciferol , (DRISDOL ) 1.25 MG (50000 UNIT) CAPS capsule Take 1 capsule (50,000 Units total) by mouth every 7 (seven) days.   [DISCONTINUED] amLODipine  (NORVASC ) 5 MG tablet Take 1 tablet (5 mg total) by mouth daily.   [DISCONTINUED] cloNIDine  (CATAPRES ) 0.1 MG tablet Take 1 tablet (0.1 mg total) by mouth 2 (two) times daily.     Allergies:   Patient has no known allergies.   Social History   Socioeconomic History   Marital status: Single    Spouse name: Not on file   Number of children: Not on file   Years of education: Not on file   Highest education  level: Not on file  Occupational History   Not on file  Tobacco Use   Smoking status: Never   Smokeless tobacco: Never  Vaping Use   Vaping status: Never Used  Substance and Sexual Activity   Alcohol use: Yes    Comment: social    Drug use: No   Sexual activity: Yes    Partners: Female    Birth control/protection: Condom  Other Topics Concern   Not on file  Social History Narrative   Not on file   Social Drivers of Health   Tobacco Use: Low Risk (10/08/2024)   Patient History    Smoking Tobacco Use: Never    Smokeless Tobacco Use: Never    Passive Exposure: Not on file  Financial Resource Strain: Low Risk (04/09/2024)   Overall Financial Resource Strain (CARDIA)    Difficulty of Paying Living Expenses: Not hard at all  Food Insecurity: No Food Insecurity (04/09/2024)   Epic    Worried About Programme Researcher, Broadcasting/film/video in the Last Year: Never true    Ran Out of Food in the Last Year: Never true  Transportation Needs: No Transportation Needs (04/09/2024)   Epic    Lack of Transportation (Medical): No    Lack of Transportation (Non-Medical): No  Physical Activity: Sufficiently Active (07/06/2023)   Exercise Vital Sign    Days of Exercise per Week: 6 days    Minutes of Exercise per Session: 30 min  Stress: No Stress Concern Present (04/09/2024)   [employer] of Occupational Health - Occupational Stress Questionnaire    Feeling of Stress: Not at all  Social Connections: Not on file  Depression (PHQ2-9): Low Risk (09/24/2024)   Depression (PHQ2-9)    PHQ-2 Score: 0  Alcohol Screen: Low Risk (04/09/2024)   Alcohol Screen    Last Alcohol Screening Score (AUDIT): 1  Housing: Unknown (04/09/2024)   Epic    Unable to Pay for Housing in the Last Year: No    Number of Times Moved in the Last Year: Not on file    Homeless in the Last Year: No  Utilities: Not At Risk (04/09/2024)   Epic    Threatened with loss of utilities: No  Health Literacy: Adequate Health Literacy (04/09/2024)   B1300 Health Literacy    Frequency of need for help with medical instructions: Never     Family History: The patient's family history includes Diabetes in his maternal grandfather; Hypertension in his mother.  ROS:   Please see the  history of present illness.     All other systems reviewed and are negative.  EKGs/Labs/Other Studies Reviewed:         Recent Labs: 07/12/2024: ALT 24; TSH 0.973 09/24/2024: BUN 8; Creatinine, Ser 1.08; Potassium 4.0; Sodium 140   Recent Lipid Panel    Component Value Date/Time   CHOL 145 07/12/2024 1041   TRIG 78 07/12/2024 1041   HDL 35 (L) 07/12/2024 1041   CHOLHDL 4.1 07/12/2024 1041   LDLCALC 95 07/12/2024 1041    Physical Exam:   VS:  BP (!) 142/90   Pulse 75   Ht 6' 4.5 (1.943 m)   Wt (!) 415 lb 12.8 oz (188.6 kg)   SpO2 97%   BMI 49.95 kg/m  , BMI Body mass index is 49.95 kg/m.  Vitals:   10/08/24 1419 10/08/24 1447  BP: (!) 168/112 (!) 142/90  Pulse: 75   Height: 6' 4.5 (1.943 m)   Weight: (!) 415 lb 12.8 oz (188.6  kg)   SpO2: 97%   BMI (Calculated): 49.96     GENERAL:  Well appearing, overweight HEENT: Pupils equal round and reactive, fundi not visualized, oral mucosa unremarkable NECK:  No jugular venous distention, waveform within normal limits, carotid upstroke brisk and symmetric, no bruits, no thyromegaly LYMPHATICS:  No cervical adenopathy LUNGS:  Clear to auscultation bilaterally HEART:  RRR.  PMI not displaced or sustained,S1 and S2 within normal limits, no S3, no S4, no clicks, no rubs, no murmurs ABD:  Flat, positive bowel sounds normal in frequency in pitch, no bruits, no rebound, no guarding, no midline pulsatile mass, no hepatomegaly, no splenomegaly EXT:  2 plus pulses throughout, no edema, no cyanosis no clubbing SKIN:  No rashes no nodules NEURO:  Cranial nerves II through XII grossly intact, motor grossly intact throughout PSYCH:  Cognitively intact, oriented to person place and time   ASSESSMENT/PLAN:    HTN - BP not at goal <130/80 but improved from previous. Home readings 130-140s. Valsartan -hydrochlorothiazide  previously ineffective.  Medication management: Continue Amlodipine  5mg  daily, he is hesitant to increase due to  possible side effect of edema. Continue Coreg  25mg  BID, clonidine  0.1mg  BID, Olmesartan -hydrochlorothiazide  40-25mg  daily. Refills provided. Prefers to continue to work on lifestyle changes to lower BP. Reviewed options for low sodium options when packing lunch or eating out. 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 If BP not at goal <130/80 at follow up , consider increased dose Clonidine  vs initiation of spironolactone 25mg  daily.  Discussed to monitor BP at home at least 2 hours after medications and sitting for 5-10 minutes.  Secondary workup: No snoring suggestive of OSA. Politely declines sleep study today, does occasionally need a nap midday but this could also be caused by Clonidine .  05/2023 normal TSH 06/2023 renin-aldosterone, metanephrines, catecholamines unremarkable.  02/2024 no renal artery stenosis  Recommend aiming for 150 minutes of moderate intensity activity per week and following a heart healthy diet.    Obesity - Weight loss via diet and exercise encouraged. Discussed the impact being overweight would have on cardiovascular risk. Wegovy  or Zepbound  not covered for weight loss by prior auth team check 08/2024.   Screening for Secondary Hypertension:     07/06/2023   12:54 PM  Causes  Renovascular HTN Screened  Sleep Apnea N/A     - Comments no snoring nor daytime somnolence  Thyroid Disease Screened     - Comments 05/2023 normal TSH  Hyperaldosteronism Screened  Pheochromocytoma Screened  Coarctation of the Aorta N/A     - Comments BP symmetrical  Compliance Screened    Relevant Labs/Studies:    Latest Ref Rng & Units 09/24/2024    9:06 AM 08/09/2024   10:00 AM 07/12/2024   10:41 AM  Basic Labs  Sodium 134 - 144 mmol/L 140  138  141   Potassium 3.5 - 5.2 mmol/L 4.0  4.0  4.1   Creatinine 0.76 - 1.27 mg/dL 8.91  9.11  8.89        Latest Ref Rng & Units 07/12/2024   10:41 AM 06/30/2023    1:11 PM  Thyroid   TSH 0.450 - 4.500 uIU/mL 0.973  1.260        Latest  Ref Rng & Units 07/06/2023   12:20 PM  Renin/Aldosterone   Aldosterone 0.0 - 30.0 ng/dL 83.5   Aldos/Renin Ratio 0.0 - 30.0 9.5        Latest Ref Rng & Units 07/06/2023   12:20 PM  Metanephrines/Catecholamines  Epinephrine  0 - 62 pg/mL 48   Norepinephrine 0 - 874 pg/mL 653   Dopamine 0 - 48 pg/mL <30   Metanephrines 0.0 - 88.0 pg/mL <25.0   Normetanephrines  0.0 - 210.1 pg/mL 86.7             Disposition:    FU with MD/PharmD/APP in 2-3 months  Medication Adjustments/Labs and Tests Ordered: Current medicines are reviewed at length with the patient today.  Concerns regarding medicines are outlined above.  No orders of the defined types were placed in this encounter.  Meds ordered this encounter  Medications   amLODipine  (NORVASC ) 5 MG tablet    Sig: Take 1 tablet (5 mg total) by mouth daily.    Dispense:  90 tablet    Refill:  3    Supervising Provider:   CHRISTOPHER, BRIDGETTE [[id number]]   cloNIDine  (CATAPRES ) 0.1 MG tablet    Sig: Take 1 tablet (0.1 mg total) by mouth 2 (two) times daily.    Dispense:  180 tablet    Refill:  3    Supervising Provider:   LONNI SLAIN [[id number]]     Signed, Reche GORMAN Finder, NP  10/08/2024 2:53 PM     [facility]

## 2024-10-09 ENCOUNTER — Emergency Department

## 2024-10-09 DIAGNOSIS — Z5321 Procedure and treatment not carried out due to patient leaving prior to being seen by health care provider: Secondary | ICD-10-CM | POA: Diagnosis not present

## 2024-10-09 DIAGNOSIS — R202 Paresthesia of skin: Secondary | ICD-10-CM | POA: Insufficient documentation

## 2024-10-09 LAB — DIFFERENTIAL
Abs Immature Granulocytes: 0.05 K/uL (ref 0.00–0.07)
Basophils Absolute: 0.1 K/uL (ref 0.0–0.1)
Basophils Relative: 1 %
Eosinophils Absolute: 0.1 K/uL (ref 0.0–0.5)
Eosinophils Relative: 1 %
Immature Granulocytes: 1 %
Lymphocytes Relative: 33 %
Lymphs Abs: 3.5 K/uL (ref 0.7–4.0)
Monocytes Absolute: 0.7 K/uL (ref 0.1–1.0)
Monocytes Relative: 7 %
Neutro Abs: 6.1 K/uL (ref 1.7–7.7)
Neutrophils Relative %: 57 %
Smear Review: NORMAL

## 2024-10-09 LAB — COMPREHENSIVE METABOLIC PANEL WITH GFR
ALT: 28 U/L (ref 0–44)
AST: 26 U/L (ref 15–41)
Albumin: 4.2 g/dL (ref 3.5–5.0)
Alkaline Phosphatase: 71 U/L (ref 38–126)
Anion gap: 14 (ref 5–15)
BUN: 13 mg/dL (ref 6–20)
CO2: 24 mmol/L (ref 22–32)
Calcium: 8.9 mg/dL (ref 8.9–10.3)
Chloride: 101 mmol/L (ref 98–111)
Creatinine, Ser: 1.05 mg/dL (ref 0.61–1.24)
GFR, Estimated: >60 mL/min (ref >60)
Glucose, Bld: 171 mg/dL — ABNORMAL HIGH (ref 70–99)
Potassium: 3.4 mmol/L — ABNORMAL LOW (ref 3.5–5.1)
Sodium: 139 mmol/L (ref 135–145)
Total Bilirubin: 1.4 mg/dL — ABNORMAL HIGH (ref 0.0–1.2)
Total Protein: 7.5 g/dL (ref 6.5–8.1)

## 2024-10-09 LAB — CBC
HCT: 45.2 % (ref 39.0–52.0)
Hemoglobin: 15.8 g/dL (ref 13.0–17.0)
MCH: 33.1 pg (ref 26.0–34.0)
MCHC: 35 g/dL (ref 30.0–36.0)
MCV: 94.8 fL (ref 80.0–100.0)
Platelets: 244 K/uL (ref 150–400)
RBC: 4.77 MIL/uL (ref 4.22–5.81)
RDW: 11.9 % (ref 11.5–15.5)
WBC: 10.6 K/uL — ABNORMAL HIGH (ref 4.0–10.5)
nRBC: 0 % (ref 0.0–0.2)

## 2024-10-09 LAB — PROTIME-INR
INR: 1 (ref 0.8–1.2)
Prothrombin Time: 14 s (ref 11.4–15.2)

## 2024-10-09 LAB — APTT: aPTT: 33 s (ref 24–36)

## 2024-10-09 LAB — ETHANOL: Alcohol, Ethyl (B): <15 mg/dL (ref <15)

## 2024-10-09 MED ORDER — SODIUM CHLORIDE 0.9% FLUSH: 3.0000 mL | Freq: Once | INTRAVENOUS | Status: DC

## 2024-10-09 NOTE — ED Triage Notes (Signed)
 Pt reports left forearm tingling started at 1600. No weakness, aphasia, dysarthria, pronator drift. Verbal orders Dr Viviann

## 2024-10-10 ENCOUNTER — Emergency Department
Admission: EM | Admit: 2024-10-10 | Discharge: 2024-10-10 | Discharge status: Against Medical Advice | Attending: Emergency Medicine | Admitting: Emergency Medicine

## 2024-10-29 ENCOUNTER — Ambulatory Visit: Admitting: Family Medicine

## 2024-12-12 ENCOUNTER — Encounter (HOSPITAL_BASED_OUTPATIENT_CLINIC_OR_DEPARTMENT_OTHER): Admitting: Family

## 2024-12-27 ENCOUNTER — Ambulatory Visit: Admitting: Family Medicine
